# Patient Record
Sex: Male | Born: 1940 | Race: White | Hispanic: No | Marital: Married | Smoking: Never smoker
Health system: Southern US, Community
[De-identification: ages and names within clinical notes are randomized; demographics above are authoritative.]

## PROBLEM LIST (undated history)

## (undated) DIAGNOSIS — J9621 Acute and chronic respiratory failure with hypoxia: Secondary | ICD-10-CM

## (undated) DIAGNOSIS — I10 Essential (primary) hypertension: Secondary | ICD-10-CM

## (undated) DIAGNOSIS — R519 Headache, unspecified: Secondary | ICD-10-CM

## (undated) DIAGNOSIS — R351 Nocturia: Secondary | ICD-10-CM

## (undated) DIAGNOSIS — Z930 Tracheostomy status: Secondary | ICD-10-CM

## (undated) DIAGNOSIS — Z973 Presence of spectacles and contact lenses: Secondary | ICD-10-CM

## (undated) DIAGNOSIS — J398 Other specified diseases of upper respiratory tract: Secondary | ICD-10-CM

## (undated) DIAGNOSIS — R51 Headache: Secondary | ICD-10-CM

## (undated) DIAGNOSIS — M1990 Unspecified osteoarthritis, unspecified site: Secondary | ICD-10-CM

## (undated) DIAGNOSIS — J690 Pneumonitis due to inhalation of food and vomit: Secondary | ICD-10-CM

## (undated) DIAGNOSIS — R350 Frequency of micturition: Secondary | ICD-10-CM

## (undated) DIAGNOSIS — Z8709 Personal history of other diseases of the respiratory system: Secondary | ICD-10-CM

## (undated) DIAGNOSIS — F419 Anxiety disorder, unspecified: Secondary | ICD-10-CM

## (undated) DIAGNOSIS — S14103S Unspecified injury at C3 level of cervical spinal cord, sequela: Secondary | ICD-10-CM

## (undated) DIAGNOSIS — E7800 Pure hypercholesterolemia, unspecified: Secondary | ICD-10-CM

## (undated) HISTORY — PX: KNEE ARTHROSCOPY: SUR90

## (undated) HISTORY — PX: COLONOSCOPY: SHX174

## (undated) HISTORY — PX: HEMORRHOID SURGERY: SHX153

## (undated) HISTORY — DX: Essential (primary) hypertension: I10

## (undated) HISTORY — PX: EYE SURGERY: SHX253

## (undated) HISTORY — PX: ESOPHAGOGASTRODUODENOSCOPY: SHX1529

## (undated) HISTORY — PX: BACK SURGERY: SHX140

## (undated) HISTORY — DX: Pure hypercholesterolemia, unspecified: E78.00

---

## 2003-07-06 ENCOUNTER — Encounter: Payer: Self-pay | Admitting: Specialist

## 2003-07-13 ENCOUNTER — Encounter: Payer: Self-pay | Admitting: Specialist

## 2003-07-13 ENCOUNTER — Inpatient Hospital Stay (HOSPITAL_COMMUNITY): Admission: RE | Admit: 2003-07-13 | Discharge: 2003-07-15 | Payer: Self-pay | Admitting: Specialist

## 2008-04-25 ENCOUNTER — Encounter: Admission: RE | Admit: 2008-04-25 | Discharge: 2008-04-25 | Payer: Self-pay | Admitting: Specialist

## 2011-02-08 NOTE — Op Note (Signed)
NAME:  Anthony Andersen, Anthony Andersen NO.:  [id number]   MEDICAL RECORD NO.:  [id number]                   PATIENT TYPE:  AMB   LOCATION:  DAY                                  FACILITY:  [facility]   PHYSICIAN:  Jene Every, M.D.                 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   OPERATION/PROCEDURE:  Essential laminectomies of L3 and L4 with redo  decompression L3-4 and partial of L4-5.   SURGEON:  Jene Every, M.D.   ANESTHESIA:  General.   ASSISTANT:  Roma Schanz, P.A.-C.   BRIEF HISTORY AND INDICATIONS:  This is a 70 year old status post a  decompression and laminectomies of L3-4 and L4-5.  He had a spinous  processes removal of L4-5.  The patient had severe stenosis at the adjacent  segment above the level of decompression at L3-4, partially at L2-3, had a  residual neural arch at L4.  Operative intervention was indicated for  decompression at L3-4 and the adjacent segments of L2-3 and L4-5.  The risks  and benefits were discussed including bleeding, infection, anesthetic risks,  CSF leakage, epidural fibrosis, segment disease, and need for fusion in the  future, etc.   DESCRIPTION OF PROCEDURE:  With the patient in the supine position and after  induction of adequate general anesthesia and 1 g of Kefzol, the patient was  placed prone on the McBride frame.  All bony prominences were well padded.  The lumbar region was prepped and draped in the usual sterile fashion.   Previous surgical incision was utilized and extended cephalad to the  palpable spinous processes of L3 and L2.  Subcutaneous tissue was dissected  by using electrocautery to achieve hemostasis.  Dorsal lumbar fascia was  identified and divided in  line  with the skin incision.  Paraspinous muscle  elevated from the lamina of L2-3 and the residual neural arch of L4.  McCullough retractor was placed.  Confirmatory radiographs obtained.  I  removed the spinous process of L3 and partial of L2.  I then performed a  laminectomy of L3, first entering laterally utilizing osteotomes to remove  the medial facet of L3-4.  I then used a 2 mm Kerrison and finally a 3 mm  Kerrison to remove the lamina of L3.  There was stenosis at L2-3 in the  lateral recess.  Neural elements was well protected and we decompressed the  lateral recess with the 2 mm and 3 mm Kerrisons.  Next, we continued caudad.  There was a residual neural arch at L4 and this was removed with the 2 mm  Kerrison.  This was decompressed with the 2 mm Kerrison.  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  The operating microscope was draped and brought into the surgical field.  I  performed foraminotomies of L3 and L4 bilaterally.  The L3 and L4 foramen  was fairly stenotic on the left.  After decompression of the neural arch and  the epidural fibrosis, the severe compression of the L3-4 essentially was  relieved.  We decompressed the upper half of the lateral recess of L4-5.  Bipolar electrocautery was utilized to achieve hemostasis.  We used bone wax  on the cancellous surfaces.  __________ probe placed on the foramen of L2,  L3 and L4 bilaterally as well as in the L5 and found them to be widely  patent.  We decompressed to the pedicles bilaterally.  It did not move more  than the 50% of the facets and the pars were preserved bilaterally.  Bipolar  electrocautery was utilized to achieve strict hemostasis.  Copiously  irrigated with antibiotic irrigation.  No evidence of CSF leak or active  bleeding.  Thrombin-soaked Gelfoam was placed in the laminotomy defect.  We  placed a Hemovac and brought it out through a lateral stab wound in the   skin. Fascia was repaired with #1 Vicryl interrupted figure-of-eight  sutures, subcutaneous tissue reapproximated with 2-0 Vicryl interrupted  sutures.  Skin reapproximated with staples.  Wound was dressed sterilely.  The patient was placed supine on the hospital bed, extubated without  difficulty and transported to the recovery room in stable condition.  The  patient tolerated the procedure well.  There were no complications.  Estimated blood loss 400 mL.                                               Jene Every, M.D.    Cordelia Pen  D:  07/13/2003  T:  07/13/2003  Job:  [id number]

## 2011-02-08 NOTE — H&P (Signed)
NAME:  Anthony Andersen, Anthony Andersen NO.:  [id number]   MEDICAL RECORD NO.:  [id number]                   PATIENT TYPE:   LOCATION:                                       FACILITY:   PHYSICIAN:  Jene Every, M.D.                 DATE OF BIRTH:  Feb 19, 1941   DATE OF ADMISSION:  07/01/2003  DATE OF DISCHARGE:                                HISTORY & PHYSICAL   CHIEF COMPLAINT:  Low back pain with radicular pain into the left lower  extremity.   HISTORY:  Mr. Lybrand is a 70 year old gentleman with complaints of bilateral  lower extremity pain.  The patient was initially seen by Caralyn Guile. Ramos,  M.D. for evaluation of spinal stenosis L3-4.  He is status post lumbar  decompression L4-5 by Paul Dykes. Fannie Knee, M.D. approximately six years ago.  The  patient has had a return in his symptoms over the past year with increasing  lower extremity pain that requires him to flex forward to get any  significant relief.  He states he is unable to walk for extended periods of  time.  MRI was obtained by Caralyn Guile. Ramos, M.D. which showed severe  stenosis L3-4.  The patient does have diminished knee reflexes bilaterally.  Quadriceps strength is 5/-5.  He does have numbness on the lateral aspect on  the right side.  Films again were reviewed which did show slight 1 cm  listhesis in flexion/extension at L4-5 with congenitally short pedicles.  His MRI shows severe stenosis L3-4 that is multifactorial.  It is not felt  that the patient would benefit from epidural steroid injections due to the  severity of his symptoms.  It is felt he would benefit from a decompression  L3-4 level.  The risks and benefits of the surgery were discussed with the  patient in detail and he wishes to proceed.  It was also discussed with him  possibly the need to decompress L2-3 and L4-5 if needed.  This will be  determined intraoperatively.   PAST MEDICAL HISTORY:  1. Hypertension.  2. Hyperlipidemia.  3.  Osteoarthritis.   CURRENT MEDICATIONS:  1. Lipitor 20 mg one p.o. daily.  2. Accupril 40 mg one p.o. daily.  3. Norvasc 5 mg one p.o. daily.   ALLERGIES:  No known drug allergies.   PAST SURGICAL HISTORY:  1. Microdiskectomy L4-5 in 1998.  2. Penile implant in 2003.   SOCIAL HISTORY:  The patient is married.  He denies any tobacco or alcohol  intake.  His wife will be his caregiver following surgery.   FAMILY HISTORY:  Mother had multiple medical problems including coronary  artery disease, hypertension, breast cancer, osteoarthritis.  Father  deceased at age 37 of cancer.   REVIEW OF SYSTEMS:  GENERAL:  The patient denies any fevers, chills, night  sweats, or bleeding tendencies.  CNS:  No  blurred/double vision, seizure,  headache, or paralysis.  RESPIRATORY:  No shortness of breath, productive  cough, or hemoptysis.  CARDIOVASCULAR:  No chest pain, angina, orthopnea.  GENITOURINARY:  No dysuria, hematuria, discharge.  GASTROINTESTINAL:  No  nausea, vomiting, diarrhea, constipation, melena, or bloody stools.   PHYSICAL EXAMINATION:  VITAL SIGNS:  Pulse 100, respiratory 16, blood  pressure 132/88.  GENERAL:  This is a well-developed, well-nourished 70 year old gentleman in  mild to moderate distress.  He does walk with antalgic gait.  HEENT:  Atraumatic, normocephalic.  Pupils are equal, round, and reactive to  light.  EOMs intact.  NECK:  Supple.  No lymphadenopathy.  CHEST:  Clear to auscultation bilaterally.  No rhonchi, wheezes, or rales.  BREASTS:  Not examined.  Not pertinent to HPI.  GENITOURINARY:  Not examined.  Not pertinent to HPI.  HEART:  Regular rate and rhythm without murmurs, rubs, or gallops.  ABDOMEN:  Soft, nontender, nondistended.  Bowel sounds x4.  SKIN:  No rashes or lesions are noted.  EXTREMITIES:  The patient does have diminished knee reflexes bilaterally.  Quadriceps strength is  5/-5.  Does have numbness on the lateral aspect of the right lower   extremity.   IMPRESSION:  Severe spinal stenosis L3-4.   PLAN:  The patient will be admitted to [facility] to undergo a  decompression at L3-4 with possible decompression L2-3 and L4-5 to be  determined intraoperatively.     Roma Schanz, P.A.                   Jene Every, M.D.    CS/MEDQ  D:  07/01/2003  T:  07/01/2003  Job:  [id number]

## 2015-06-21 ENCOUNTER — Ambulatory Visit (INDEPENDENT_AMBULATORY_CARE_PROVIDER_SITE_OTHER): Payer: Medicare Other | Admitting: Neurology

## 2015-06-21 ENCOUNTER — Encounter: Payer: Self-pay | Admitting: Neurology

## 2015-06-21 VITALS — BP 129/79 | HR 95 | Ht 69.0 in | Wt 222.8 lb

## 2015-06-21 DIAGNOSIS — R202 Paresthesia of skin: Secondary | ICD-10-CM

## 2015-06-21 DIAGNOSIS — R29818 Other symptoms and signs involving the nervous system: Secondary | ICD-10-CM

## 2015-06-21 DIAGNOSIS — R2689 Other abnormalities of gait and mobility: Secondary | ICD-10-CM

## 2015-06-21 DIAGNOSIS — R531 Weakness: Secondary | ICD-10-CM

## 2015-06-21 DIAGNOSIS — E538 Deficiency of other specified B group vitamins: Secondary | ICD-10-CM

## 2015-06-21 DIAGNOSIS — G609 Hereditary and idiopathic neuropathy, unspecified: Secondary | ICD-10-CM | POA: Diagnosis not present

## 2015-06-21 DIAGNOSIS — E519 Thiamine deficiency, unspecified: Secondary | ICD-10-CM

## 2015-06-21 NOTE — Progress Notes (Signed)
[facility]    Provider:  Dr Lucia Gaskins Referring Provider: Venita Lick, MD Primary Care Physician:  Margaree Mackintosh, MD  CC:  Balance problems  HPI:  Anthony Andersen is a 74 y.o. male here as a referral from Dr. Shon Baton for balance problems. He sees Dr. Ethelene Hal as pain management and has chronic LBP and a few surgeries for spinal stenosis. Symptoms worse with closing his eyes and in low light has to hold onto the walls. Balance problems started a year ago, slowly progressive, worsening. No falls. He describes weakness. Difficulty getting out of low seats. Difficulty climbing stairs, harder coming down than up and also feels balance is poor going down the stairs. No SOB, no droop eyelids, no diplopia. He is having swallowing difficulties. His voice has become weakner. No shuffling of gait, no tremors, smell is ok, no drooling, no hallucinations. He feels generally fatigued. He has tingling in the lower legs worse in the mornings. He has cramping in his legs. No recent weight loss or muscle loss. Just doesn't feel strong in the legs. He has pain in the neck and in the low back and shoulders which is chronic. He also has been referred to rheumatology for evaluation. No history of significant alcohol night. No numbness or tingling in the feet or hands. He has not noticed fasciculations anywhere. He takes Neurontin at night which helps. Memory is worsening. LBP worse with long periods of walking or standing and then he need to sit down  And sitting down helps the back pain. He has shppting pain down the left side of the leg. He also has shooting pain into his left arm but not as bad as in the left leg. He has numbness and tingling in the left leg and leg arm and in the neck.   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 Posteriorly soft tissue thickening is asymmetrically prominent on the right and results in moderate right ventral flattening of the cord contributing to moderate central canal stenosis. Mildly narrowed central canal at C3-C4 with mild anterolisthesis of C3 and mild disc bulge. Moderate right foraminal narrowing is present. Mild ventral flattening of the cord and mild central canal stenosis at C4-C5 and there are 2 mild disc bulge. Moderate left foraminal narrowing results from uncovertebral joint hypertrophy. Disc bulge at C5-C6 eccentric to the left with a small left posterolateral and foraminal protrusion and mild dorsal buckling of the ligamentum flavum results in mild central canal stenosis and moderate left foraminal narrowing. Mild disc bulge at T1-T2 with small left paracentral protrusion and mild dorsal buckling of the ligamentum flavum results in mild canal stenosis and mild left ventral flattening of the cord. 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  Review of Systems: Patient complains of symptoms per HPI as well as the following symptoms: fatigue, easy bruising, snoring, hearing loss, ringing in ears, spinning sensation, trouble swallowing, constipation, joint pain, leg cramps, aching muscles, weakness, difficulty swallowin, snoring, notenough sleep, decreased energy. Pertinent negatives per HPI. All others negative.   Social History   Social History  . Marital Status: Married    Spouse Name: Bonita Quin  . Number of Children: 1  . Years of Education: 12   Occupational History  .  Self-employed     Social History Main Topics  . Smoking status: Never Smoker   . Smokeless tobacco: Not on file  . Alcohol Use: 0.0 oz/week    0 Standard drinks or equivalent per week     Comment: Occasional   . Drug Use: No  . Sexual Activity: Not on file   Other Topics  Concern  . Not on file   Social History Narrative   Lives at home with spouse.   Caffeine use: 1 cup coffee every morning    Family History  Problem Relation Age of Onset  . Diabetes Mellitus I    . Hypertension    . Cancer Mother   . Cancer Father   . Cancer Maternal Grandmother   . Cancer Paternal Grandfather     Past Medical History  Diagnosis Date  . Hypertension   . High cholesterol     Past Surgical History  Procedure Laterality Date  . Back surgery      x2  . Hemorrhoid surgery    . Knee arthroscopy      Current Outpatient Prescriptions  Medication Sig Dispense Refill  . Acetaminophen (TYLENOL EXTRA STRENGTH PO) Take 1 tablet by mouth daily as needed.    Marland Kitchen aspirin 81 MG tablet Take 81 mg by mouth daily.    . benazepril (LOTENSIN) 20 MG tablet Take 20 mg by mouth daily.  0  . diclofenac (VOLTAREN) 75 MG EC tablet Take 75 mg by mouth 2 (two) times daily.   0  . gabapentin (NEURONTIN) 300 MG capsule Take 300 mg by mouth 3 (three) times daily.  0  . HYDROcodone-acetaminophen (NORCO) 10-325 MG tablet Take 1 tablet by mouth 4 (four) times daily as needed.  0  . Multiple Vitamins-Minerals (MULTIVITAMIN ADULT PO) Take 1 tablet by mouth daily.    Marland Kitchen tiZANidine (ZANAFLEX) 4 MG tablet Take 4 mg by mouth 4 (four) times daily as needed. Pt takes max 2 times per day  0  . ZETIA 10 MG tablet Take 10 mg by mouth daily.  0   No current facility-administered medications for this visit.    Allergies as of 06/21/2015  . (No Known Allergies)    Vitals: BP 129/79 mmHg  Pulse 95  Ht  (1.753 m)  Wt 222 lb 12.8 oz (101.061 kg)  BMI 32.89 kg/m2 Last Weight:  Wt Readings from Last 1 Encounters:  06/21/15 222 lb 12.8 oz (101.061 kg)   Last Height:   Ht Readings from Last 1 Encounters:  06/21/15  (1.753 m)   Physical exam: Exam: Gen: NAD, conversant, well nourised, obese, well groomed                     CV: RRR, no MRG. No Carotid Bruits. No peripheral edema,  warm, nontender Eyes: Conjunctivae clear without exudates or hemorrhage  Neuro: Detailed Neurologic Exam  Speech:    Speech is normal; fluent and spontaneous with normal comprehension.  Cognition:    The patient is oriented to person, place, and time;     recent and remote memory intact;     language fluent;     normal attention, concentration,     fund of knowledge Cranial Nerves:    The pupils are equal, round, and reactive to light. The fundi are nflat. Visual fields are full to finger confrontation. Extraocular movements are intact. Trigeminal sensation is intact and the muscles of mastication are normal. The face is symmetric. The  palate elevates in the midline. Hearing intact. Voice is normal. Shoulder shrug is normal. The tongue has normal motion without fasciculations.   Coordination:    Normal finger to nose and heel to shin. Normal rapid alternating movements.   Gait: good stride and turning  Motor Observation: no drift.    No asymmetry, no atrophy, and no involuntary movements noted. Tone:    Normal muscle tone.    Posture:    Mildly stooped    Strength: Difficulty getting out of seat w/o using hands but can do it Mild left hip felxion otherwise normal      Sensation: intact to LT, pin prick, 3-4 seconds vibration,  Sway on romberg.      Reflex Exam:  DTR's: Absent left AJ otherwise deep tendon reflexes in the upper and lower extremities are normal bilaterally.   Toes:    The toes are equivocal bilaterally.   Clonus:    Clonus is absent.      Assessment/Plan:   74 y.o. male here as a referral from Dr. Shon Baton for balance problems. He sees Dr. Ethelene Hal as pain management and has chronic LBP and a few surgeries for spinal stenosis. Balance problems started a year ago, slowly progressive, worsening. No falls. He describes weakness. Difficulty getting out of low seats. Difficulty climbing stairs, harder coming down than up and also feels balance is poor going down  the stairs. Exam is non-focal. 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 Neuro exam is unremarkable except for some sway on romberg. Sensory exam intact. Will order serum neuropathy panel due to reported paresthesias and polyneuropathy can cause imbalance. If labs unremarkable will order MRI of the brain.  Naomie Dean, MD  [facility] [address] [place], [place] [address]  Phone [phone] Fax [phone]

## 2015-06-21 NOTE — Patient Instructions (Signed)
Overall you are doing fairly well but I do want to suggest a few things today:   Remember to drink plenty of fluid, eat healthy meals and do not skip any meals. Try to eat protein with a every meal and eat a healthy snack such as fruit or nuts in between meals. Try to keep a regular sleep-wake schedule and try to exercise daily, particularly in the form of walking, 20-30 minutes a day, if you can.   As far as your medications are concerned, I would like to suggest Diagnostic testing: labs  I would like to see you back if needed, sooner if we need to. Please call us with any interim questions, concerns, problems, updates or refill requests.   Our phone number is [phone]. We also have an after hours call service for urgent matters and there is a physician on-call for urgent questions. For any emergencies you know to call 911 or go to the nearest emergency room

## 2015-06-23 ENCOUNTER — Telehealth: Payer: Self-pay | Admitting: Neurology

## 2015-06-23 DIAGNOSIS — R202 Paresthesia of skin: Secondary | ICD-10-CM | POA: Insufficient documentation

## 2015-06-23 DIAGNOSIS — R2689 Other abnormalities of gait and mobility: Secondary | ICD-10-CM | POA: Insufficient documentation

## 2015-06-23 DIAGNOSIS — R531 Weakness: Secondary | ICD-10-CM | POA: Insufficient documentation

## 2015-06-23 LAB — METHYLMALONIC ACID, SERUM: METHYLMALONIC ACID: 145 nmol/L (ref 0–378)

## 2015-06-23 LAB — HEMOGLOBIN A1C
ESTIMATED AVERAGE GLUCOSE: 148 mg/dL
Hgb A1c MFr Bld: 6.8 % — ABNORMAL HIGH (ref 4.8–5.6)

## 2015-06-23 LAB — B12 AND FOLATE PANEL
Folate: >20 ng/mL (ref >3.0)
Vitamin B-12: 406 pg/mL (ref 211–946)

## 2015-06-23 LAB — MULTIPLE MYELOMA PANEL, SERUM
ALBUMIN/GLOB SERPL: 1.5 (ref 0.7–1.7)
ALPHA 1: 0.2 g/dL (ref 0.0–0.4)
Albumin SerPl Elph-Mcnc: 4.1 g/dL (ref 2.9–4.4)
Alpha2 Glob SerPl Elph-Mcnc: 0.8 g/dL (ref 0.4–1.0)
B-Globulin SerPl Elph-Mcnc: 1.2 g/dL (ref 0.7–1.3)
GAMMA GLOB SERPL ELPH-MCNC: 0.6 g/dL (ref 0.4–1.8)
GLOBULIN, TOTAL: 2.8 g/dL (ref 2.2–3.9)
IGA/IMMUNOGLOBULIN A, SERUM: 218 mg/dL (ref 61–437)
IgG (Immunoglobin G), Serum: 621 mg/dL — ABNORMAL LOW (ref 700–1600)
IgM (Immunoglobulin M), Srm: 26 mg/dL (ref 15–143)
Total Protein: 6.9 g/dL (ref 6.0–8.5)

## 2015-06-23 LAB — TSH: TSH: 1.32 u[IU]/mL (ref 0.450–4.500)

## 2015-06-23 LAB — VITAMIN B1: Thiamine: 192.9 nmol/L (ref 66.5–200.0)

## 2015-06-23 NOTE — Telephone Encounter (Signed)
Call for MRi of the brain

## 2015-06-26 ENCOUNTER — Other Ambulatory Visit: Payer: Self-pay | Admitting: Neurology

## 2015-06-26 DIAGNOSIS — R2689 Other abnormalities of gait and mobility: Secondary | ICD-10-CM

## 2015-06-26 DIAGNOSIS — R531 Weakness: Secondary | ICD-10-CM

## 2015-06-26 DIAGNOSIS — W19XXXA Unspecified fall, initial encounter: Secondary | ICD-10-CM

## 2015-06-26 DIAGNOSIS — R42 Dizziness and giddiness: Secondary | ICD-10-CM

## 2015-06-26 DIAGNOSIS — R270 Ataxia, unspecified: Secondary | ICD-10-CM

## 2015-06-26 NOTE — Telephone Encounter (Signed)
LVM detailed for pt/wife to call back. Advised labs unremarkable and hemoglobin A1C was slightly elevated at 6.8. He can f/u with his PCP about this. Dr. Lucia Gaskins would also like to order MRI brain to ensure she is not missing anything. Asked them to call us back to let us know. Gave [facility] phone number and office hours.

## 2015-06-26 NOTE — Telephone Encounter (Signed)
Wife Mattis Featherly returned call

## 2015-06-26 NOTE — Telephone Encounter (Signed)
Anthony Andersen - I sent you a result note as well about this. Let patient know his labs were unremarkable. His HgbA1c is elevated at 6.8 which is slightly elevated. I would like to order an MRi of his brain just to ensure we are not missing anything. Would you ask patient if he would be open to an MRI of his brain?

## 2015-06-26 NOTE — Telephone Encounter (Signed)
I called wife back. She would like me to call husband at [phone] to find out if he wants to proceed with MRI. I will call him.

## 2015-06-26 NOTE — Telephone Encounter (Signed)
Mri of the brain ordered thanks!!

## 2015-06-26 NOTE — Telephone Encounter (Signed)
Called Anthony Andersen and he would like to do MRI brain. Said to go ahead and have Dr. Lucia Gaskins put in the order. I advised someone will call him to get this scheduled once order is placed. He verbalized understanding.

## 2015-07-05 ENCOUNTER — Ambulatory Visit
Admission: RE | Admit: 2015-07-05 | Discharge: 2015-07-05 | Disposition: A | Discharge status: Home / Self Care | Payer: Medicare Other | Source: Ambulatory Visit | Attending: Neurology | Admitting: Neurology

## 2015-07-05 DIAGNOSIS — R42 Dizziness and giddiness: Secondary | ICD-10-CM

## 2015-07-05 DIAGNOSIS — R270 Ataxia, unspecified: Secondary | ICD-10-CM | POA: Diagnosis not present

## 2015-07-05 DIAGNOSIS — W19XXXA Unspecified fall, initial encounter: Secondary | ICD-10-CM

## 2015-07-05 DIAGNOSIS — R2689 Other abnormalities of gait and mobility: Secondary | ICD-10-CM

## 2015-07-05 DIAGNOSIS — R531 Weakness: Secondary | ICD-10-CM

## 2015-07-06 ENCOUNTER — Telehealth: Payer: Self-pay | Admitting: *Deleted

## 2015-07-06 NOTE — Telephone Encounter (Signed)
Spoke w/ pt wife. Pt was not home at this time. Advised per Dr. Lucia GaskinsAhern that MRI brain unremarkable for his age. There is little atrophy (shrinkage of the brain) but this is normal as we age. No lesions, masses, or tumors or anything to be concerned about and no cause for his imbalance. She verbalized understanding. She wanted to know how to get a copy of the results. I advised she will have come to our office to sign record release form or she can sign up for Mychart. On the AVS they got at there visit, the very last page walks them through how to sign up. She can log in and see the results this way as well. There is also a phone number on there to contact if they need help with this. She verbalized understanding.

## 2015-07-06 NOTE — Telephone Encounter (Signed)
-----   Message from Anson FretAntonia B Ahern, MD sent at 07/06/2015  4:29 PM EDT ----- Let patient know MRI of the brain was unremarkable for age. There is a little atrophy but that is normal as we age. No lesions or masses or tumor or anything to be concerned about and no cause for his imbalance. thanks

## 2015-08-21 ENCOUNTER — Ambulatory Visit: Payer: Self-pay | Admitting: Physician Assistant

## 2015-08-31 ENCOUNTER — Encounter (HOSPITAL_COMMUNITY): Payer: Self-pay

## 2015-08-31 ENCOUNTER — Encounter (HOSPITAL_COMMUNITY)
Admission: RE | Admit: 2015-08-31 | Discharge: 2015-08-31 | Disposition: A | Discharge status: Home / Self Care | Payer: Medicare Other | Source: Ambulatory Visit | Attending: Orthopedic Surgery | Admitting: Orthopedic Surgery

## 2015-08-31 DIAGNOSIS — Z01812 Encounter for preprocedural laboratory examination: Secondary | ICD-10-CM | POA: Insufficient documentation

## 2015-08-31 DIAGNOSIS — M4804 Spinal stenosis, thoracic region: Secondary | ICD-10-CM | POA: Insufficient documentation

## 2015-08-31 HISTORY — DX: Anxiety disorder, unspecified: F41.9

## 2015-08-31 HISTORY — DX: Headache, unspecified: R51.9

## 2015-08-31 HISTORY — DX: Headache: R51

## 2015-08-31 HISTORY — DX: Presence of spectacles and contact lenses: Z97.3

## 2015-08-31 HISTORY — DX: Personal history of other diseases of the respiratory system: Z87.09

## 2015-08-31 HISTORY — DX: Nocturia: R35.1

## 2015-08-31 HISTORY — DX: Unspecified osteoarthritis, unspecified site: M19.90

## 2015-08-31 HISTORY — DX: Frequency of micturition: R35.0

## 2015-08-31 LAB — BASIC METABOLIC PANEL
ANION GAP: 6 (ref 5–15)
BUN: 13 mg/dL (ref 6–20)
CO2: 26 mmol/L (ref 22–32)
Calcium: 9.5 mg/dL (ref 8.9–10.3)
Chloride: 109 mmol/L (ref 101–111)
Creatinine, Ser: 0.85 mg/dL (ref 0.61–1.24)
GFR calc Af Amer: >60 mL/min (ref >60)
GLUCOSE: 122 mg/dL — AB (ref 65–99)
POTASSIUM: 4.5 mmol/L (ref 3.5–5.1)
Sodium: 141 mmol/L (ref 135–145)

## 2015-08-31 LAB — CBC
HCT: 40.5 % (ref 39.0–52.0)
Hemoglobin: 13.4 g/dL (ref 13.0–17.0)
MCH: 31.1 pg (ref 26.0–34.0)
MCHC: 33.1 g/dL (ref 30.0–36.0)
MCV: 94 fL (ref 78.0–100.0)
PLATELETS: 220 10*3/uL (ref 150–400)
RBC: 4.31 MIL/uL (ref 4.22–5.81)
RDW: 12.2 % (ref 11.5–15.5)
WBC: 7.1 10*3/uL (ref 4.0–10.5)

## 2015-08-31 LAB — SURGICAL PCR SCREEN
MRSA, PCR: NEGATIVE
Staphylococcus aureus: NEGATIVE

## 2015-08-31 NOTE — Pre-Procedure Instructions (Signed)
    Denyse AmassJohn L Prinsen  08/31/2015      [facility] [address] [place] [address] [place] [id number] Phone: [phone] Fax: [phone]    Your procedure is scheduled on Wednesday, December 14th, 2016.  [facility].M.  Call this number if you have problems the morning of surgery:  [phone]   Remember:  Do not eat food or drink liquids after midnight.   Take these medicines the morning of surgery with A SIP OF WATER: Acetaminophen (Tylenol) if needed, Gabapentin (Neurontin), Hydrocodone-acetaminophen (Norco) if needed, Tizanidine (Zanaflex) if needed.    Stop taking: Diclofenac (Voltaren), Aspirin, NSAIDS, Aleve, Naproxen, Ibuprofen, BC's, Goody's, Advil, Motrin, all herbal medications, all vitamins.     Do not wear jewelry.  Do not wear lotions, powders, or colognes.  You may NOT wear deodorant.  Men may shave face and neck.  Do not bring valuables to the hospital.  [facility] is not responsible for any belongings or valuables.  Contacts, dentures or bridgework may not be worn into surgery.  Leave your suitcase in the car.  After surgery it may be brought to your room.  For patients admitted to the hospital, discharge time will be determined by your treatment team.  Patients discharged the day of surgery will not be allowed to drive home.   Special instructions:  See attached.   Please read over the following fact sheets that you were given. Pain Booklet, Coughing and Deep Breathing, MRSA Information and Surgical Site Infection Prevention

## 2015-08-31 NOTE — Progress Notes (Signed)
PCP- Dr. Margaree MackintoshJohn Andersen Cardiologist - denies  EKG - requested CXR- denies  Echo/stress test/cardiac cath - denies  Patient denies chest pain and shortness of breath at PAT appointment.  Patient states that he recently had an EKG at Dr. Loralie ChampagneMcKinney's office.  EKG has been requested.  If not received, will need EKG DOS.

## 2015-08-31 NOTE — Progress Notes (Signed)
   08/31/15 0923  OBSTRUCTIVE SLEEP APNEA  Have you ever been diagnosed with sleep apnea through a sleep study? No  Do you snore loudly (loud enough to be heard through closed doors)?  1  Do you often feel tired, fatigued, or sleepy during the daytime (such as falling asleep during driving or talking to someone)? 1  Has anyone observed you stop breathing during your sleep? 0  Do you have, or are you being treated for high blood pressure? 1  BMI more than 35 kg/m2? 0  Age > 50 (1-yes) 1  Neck circumference greater than:Male 16 inches or larger, Male 17inches or larger? 0  Male Gender (Yes=1) 1  Obstructive Sleep Apnea Score 5

## 2015-09-05 NOTE — Progress Notes (Signed)
Call to Dr. Loralie ChampagneMcKinney's office, on ans. Service, needing EKG, med. Clearance & LOV

## 2015-09-06 ENCOUNTER — Ambulatory Visit (HOSPITAL_COMMUNITY): Payer: Medicare Other

## 2015-09-06 ENCOUNTER — Ambulatory Visit (HOSPITAL_COMMUNITY): Payer: Medicare Other | Admitting: Certified Registered Nurse Anesthetist

## 2015-09-06 ENCOUNTER — Ambulatory Visit (HOSPITAL_COMMUNITY)
Admission: AD | Admit: 2015-09-06 | Discharge: 2015-09-07 | Disposition: A | Discharge status: Home / Self Care | Payer: Medicare Other | Source: Ambulatory Visit | Attending: Orthopedic Surgery | Admitting: Orthopedic Surgery

## 2015-09-06 ENCOUNTER — Encounter (HOSPITAL_COMMUNITY)
Admission: AD | Disposition: A | Discharge status: Home / Self Care | Payer: Medicare Other | Source: Ambulatory Visit | Attending: Orthopedic Surgery

## 2015-09-06 ENCOUNTER — Encounter (HOSPITAL_COMMUNITY): Payer: Self-pay | Admitting: *Deleted

## 2015-09-06 DIAGNOSIS — Z7982 Long term (current) use of aspirin: Secondary | ICD-10-CM | POA: Insufficient documentation

## 2015-09-06 DIAGNOSIS — Z79899 Other long term (current) drug therapy: Secondary | ICD-10-CM | POA: Diagnosis not present

## 2015-09-06 DIAGNOSIS — I10 Essential (primary) hypertension: Secondary | ICD-10-CM | POA: Diagnosis not present

## 2015-09-06 DIAGNOSIS — E7800 Pure hypercholesterolemia, unspecified: Secondary | ICD-10-CM | POA: Insufficient documentation

## 2015-09-06 DIAGNOSIS — J45909 Unspecified asthma, uncomplicated: Secondary | ICD-10-CM | POA: Insufficient documentation

## 2015-09-06 DIAGNOSIS — M4804 Spinal stenosis, thoracic region: Secondary | ICD-10-CM | POA: Diagnosis not present

## 2015-09-06 DIAGNOSIS — M1990 Unspecified osteoarthritis, unspecified site: Secondary | ICD-10-CM | POA: Diagnosis not present

## 2015-09-06 DIAGNOSIS — Z419 Encounter for procedure for purposes other than remedying health state, unspecified: Secondary | ICD-10-CM

## 2015-09-06 DIAGNOSIS — M4714 Other spondylosis with myelopathy, thoracic region: Secondary | ICD-10-CM | POA: Diagnosis not present

## 2015-09-06 DIAGNOSIS — M5134 Other intervertebral disc degeneration, thoracic region: Secondary | ICD-10-CM | POA: Diagnosis present

## 2015-09-06 HISTORY — PX: LUMBAR LAMINECTOMY/DECOMPRESSION MICRODISCECTOMY: SHX5026

## 2015-09-06 SURGERY — LUMBAR LAMINECTOMY/DECOMPRESSION MICRODISCECTOMY 2 LEVELS
Anesthesia: General

## 2015-09-06 MED ORDER — FLEET ENEMA 7-19 GM/118ML RE ENEM: 1.0000 | ENEMA | Freq: Once | RECTAL | Status: DC

## 2015-09-06 MED ORDER — PHENYLEPHRINE 40 MCG/ML (10ML) SYRINGE FOR IV PUSH (FOR BLOOD PRESSURE SUPPORT)
PREFILLED_SYRINGE | INTRAVENOUS | Status: AC
  Filled 2015-09-06: qty 10

## 2015-09-06 MED ORDER — FENTANYL CITRATE (PF) 250 MCG/5ML IJ SOLN
INTRAMUSCULAR | Status: DC | PRN
  Administered 2015-09-06: 150 ug via INTRAVENOUS
  Administered 2015-09-06: 100 ug via INTRAVENOUS

## 2015-09-06 MED ORDER — DEXAMETHASONE SODIUM PHOSPHATE 4 MG/ML IJ SOLN
4.0000 mg | Freq: Four times a day (QID) | INTRAMUSCULAR | Status: AC
  Administered 2015-09-06: 4 mg via INTRAVENOUS
  Filled 2015-09-06 (×3): qty 1

## 2015-09-06 MED ORDER — FENTANYL CITRATE (PF) 250 MCG/5ML IJ SOLN
INTRAMUSCULAR | Status: AC
  Filled 2015-09-06: qty 5

## 2015-09-06 MED ORDER — HYDROMORPHONE HCL 1 MG/ML IJ SOLN: 0.2500 mg | INTRAMUSCULAR | Status: DC | PRN

## 2015-09-06 MED ORDER — SUCCINYLCHOLINE CHLORIDE 20 MG/ML IJ SOLN
INTRAMUSCULAR | Status: DC | PRN
  Administered 2015-09-06: 100 mg via INTRAVENOUS

## 2015-09-06 MED ORDER — MIDAZOLAM HCL 2 MG/2ML IJ SOLN
INTRAMUSCULAR | Status: DC | PRN
  Administered 2015-09-06: 2 mg via INTRAVENOUS

## 2015-09-06 MED ORDER — PROPOFOL 10 MG/ML IV BOLUS
INTRAVENOUS | Status: DC | PRN
  Administered 2015-09-06: 50 mg via INTRAVENOUS
  Administered 2015-09-06: 150 mg via INTRAVENOUS

## 2015-09-06 MED ORDER — 0.9 % SODIUM CHLORIDE (POUR BTL) OPTIME
TOPICAL | Status: DC | PRN
  Administered 2015-09-06: 1000 mL

## 2015-09-06 MED ORDER — SUCCINYLCHOLINE CHLORIDE 20 MG/ML IJ SOLN
INTRAMUSCULAR | Status: AC
  Filled 2015-09-06: qty 1

## 2015-09-06 MED ORDER — METOPROLOL TARTRATE 1 MG/ML IV SOLN
INTRAVENOUS | Status: AC
  Filled 2015-09-06: qty 5

## 2015-09-06 MED ORDER — ACETAMINOPHEN 10 MG/ML IV SOLN
INTRAVENOUS | Status: DC | PRN
  Administered 2015-09-06: 1000 mg via INTRAVENOUS

## 2015-09-06 MED ORDER — PHENYLEPHRINE HCL 10 MG/ML IJ SOLN
INTRAMUSCULAR | Status: DC | PRN
  Administered 2015-09-06: 120 ug via INTRAVENOUS
  Administered 2015-09-06: 80 ug via INTRAVENOUS
  Administered 2015-09-06: 120 ug via INTRAVENOUS
  Administered 2015-09-06: 80 ug via INTRAVENOUS

## 2015-09-06 MED ORDER — ONDANSETRON HCL 4 MG/2ML IJ SOLN
INTRAMUSCULAR | Status: DC | PRN
  Administered 2015-09-06: 4 mg via INTRAVENOUS

## 2015-09-06 MED ORDER — OXYCODONE-ACETAMINOPHEN 10-325 MG PO TABS: 1.0000 | ORAL_TABLET | ORAL | Status: AC | PRN

## 2015-09-06 MED ORDER — SODIUM CHLORIDE 0.9 % IJ SOLN: 3.0000 mL | INTRAMUSCULAR | Status: DC | PRN

## 2015-09-06 MED ORDER — PHENOL 1.4 % MT LIQD: 1.0000 | OROMUCOSAL | Status: DC | PRN

## 2015-09-06 MED ORDER — CEFAZOLIN SODIUM-DEXTROSE 2-3 GM-% IV SOLR
2.0000 g | INTRAVENOUS | Status: AC
  Administered 2015-09-06 (×2): 2 g via INTRAVENOUS
  Filled 2015-09-06: qty 50

## 2015-09-06 MED ORDER — HEMOSTATIC AGENTS (NO CHARGE) OPTIME
TOPICAL | Status: DC | PRN
  Administered 2015-09-06 (×2): 1 via TOPICAL

## 2015-09-06 MED ORDER — GABAPENTIN 300 MG PO CAPS
300.0000 mg | ORAL_CAPSULE | Freq: Three times a day (TID) | ORAL | Status: DC
  Administered 2015-09-06 – 2015-09-07 (×3): 300 mg via ORAL
  Filled 2015-09-06 (×3): qty 1

## 2015-09-06 MED ORDER — DEXAMETHASONE 4 MG PO TABS
4.0000 mg | ORAL_TABLET | Freq: Four times a day (QID) | ORAL | Status: AC
  Administered 2015-09-06 – 2015-09-07 (×2): 4 mg via ORAL
  Filled 2015-09-06 (×2): qty 1

## 2015-09-06 MED ORDER — ONDANSETRON HCL 4 MG/2ML IJ SOLN: 4.0000 mg | INTRAMUSCULAR | Status: DC | PRN

## 2015-09-06 MED ORDER — METHOCARBAMOL 1000 MG/10ML IJ SOLN: 500.0000 mg | Freq: Four times a day (QID) | INTRAVENOUS | Status: DC | PRN

## 2015-09-06 MED ORDER — CEFAZOLIN SODIUM 1-5 GM-% IV SOLN
1.0000 g | Freq: Three times a day (TID) | INTRAVENOUS | Status: AC
  Administered 2015-09-06 – 2015-09-07 (×2): 1 g via INTRAVENOUS
  Filled 2015-09-06 (×3): qty 50

## 2015-09-06 MED ORDER — EZETIMIBE 10 MG PO TABS
10.0000 mg | ORAL_TABLET | Freq: Every day | ORAL | Status: DC
  Administered 2015-09-06 – 2015-09-07 (×2): 10 mg via ORAL
  Filled 2015-09-06 (×2): qty 1

## 2015-09-06 MED ORDER — PROPOFOL 500 MG/50ML IV EMUL
INTRAVENOUS | Status: DC | PRN
  Administered 2015-09-06: 50 ug/kg/min via INTRAVENOUS
  Administered 2015-09-06 (×2): via INTRAVENOUS

## 2015-09-06 MED ORDER — METHOCARBAMOL 500 MG PO TABS
500.0000 mg | ORAL_TABLET | Freq: Four times a day (QID) | ORAL | Status: DC | PRN
  Administered 2015-09-07: 500 mg via ORAL
  Filled 2015-09-06 (×2): qty 1

## 2015-09-06 MED ORDER — ONDANSETRON HCL 4 MG/2ML IJ SOLN
INTRAMUSCULAR | Status: AC
  Filled 2015-09-06: qty 2

## 2015-09-06 MED ORDER — GLYCOPYRROLATE 0.2 MG/ML IJ SOLN
INTRAMUSCULAR | Status: AC
  Filled 2015-09-06: qty 1

## 2015-09-06 MED ORDER — ACETAMINOPHEN 10 MG/ML IV SOLN
INTRAVENOUS | Status: AC
  Filled 2015-09-06: qty 100

## 2015-09-06 MED ORDER — PHENYLEPHRINE HCL 10 MG/ML IJ SOLN
10.0000 mg | INTRAMUSCULAR | Status: DC | PRN
  Administered 2015-09-06: 25 ug/min via INTRAVENOUS

## 2015-09-06 MED ORDER — METHOCARBAMOL 500 MG PO TABS: 500.0000 mg | ORAL_TABLET | Freq: Three times a day (TID) | ORAL | Status: AC | PRN

## 2015-09-06 MED ORDER — LACTATED RINGERS IV SOLN
INTRAVENOUS | Status: DC | PRN
  Administered 2015-09-06 (×3): via INTRAVENOUS

## 2015-09-06 MED ORDER — THROMBIN 20000 UNITS EX SOLR
CUTANEOUS | Status: AC
  Filled 2015-09-06: qty 20000

## 2015-09-06 MED ORDER — ARTIFICIAL TEARS OP OINT
TOPICAL_OINTMENT | OPHTHALMIC | Status: DC | PRN
  Administered 2015-09-06: 1 via OPHTHALMIC

## 2015-09-06 MED ORDER — BUPIVACAINE-EPINEPHRINE (PF) 0.25% -1:200000 IJ SOLN
INTRAMUSCULAR | Status: DC | PRN
  Administered 2015-09-06: 10 mL

## 2015-09-06 MED ORDER — OXYCODONE HCL 5 MG PO TABS
10.0000 mg | ORAL_TABLET | ORAL | Status: DC | PRN
  Administered 2015-09-06 – 2015-09-07 (×3): 10 mg via ORAL
  Filled 2015-09-06 (×3): qty 2

## 2015-09-06 MED ORDER — DOCUSATE SODIUM 100 MG PO CAPS
100.0000 mg | ORAL_CAPSULE | Freq: Two times a day (BID) | ORAL | Status: DC
  Administered 2015-09-06 – 2015-09-07 (×2): 100 mg via ORAL
  Filled 2015-09-06 (×2): qty 1

## 2015-09-06 MED ORDER — MIDAZOLAM HCL 2 MG/2ML IJ SOLN
INTRAMUSCULAR | Status: AC
  Filled 2015-09-06: qty 2

## 2015-09-06 MED ORDER — LACTATED RINGERS IV SOLN
INTRAVENOUS | Status: DC
  Administered 2015-09-06: 16:00:00 via INTRAVENOUS

## 2015-09-06 MED ORDER — LIDOCAINE HCL (CARDIAC) 20 MG/ML IV SOLN
INTRAVENOUS | Status: DC | PRN
  Administered 2015-09-06: 60 mg via INTRAVENOUS

## 2015-09-06 MED ORDER — DEXAMETHASONE SODIUM PHOSPHATE 10 MG/ML IJ SOLN
INTRAMUSCULAR | Status: DC | PRN
  Administered 2015-09-06: 10 mg via INTRAVENOUS

## 2015-09-06 MED ORDER — BUPIVACAINE-EPINEPHRINE (PF) 0.25% -1:200000 IJ SOLN
INTRAMUSCULAR | Status: AC
  Filled 2015-09-06: qty 30

## 2015-09-06 MED ORDER — SODIUM CHLORIDE 0.9 % IJ SOLN
3.0000 mL | Freq: Two times a day (BID) | INTRAMUSCULAR | Status: DC
  Administered 2015-09-07: 3 mL via INTRAVENOUS

## 2015-09-06 MED ORDER — THROMBIN 5000 UNITS EX SOLR
CUTANEOUS | Status: DC | PRN
  Administered 2015-09-06: 5000 [IU] via TOPICAL

## 2015-09-06 MED ORDER — HYDROMORPHONE HCL 1 MG/ML IJ SOLN
INTRAMUSCULAR | Status: DC | PRN
  Administered 2015-09-06: .1 mg via INTRAVENOUS
  Administered 2015-09-06: .2 mg via INTRAVENOUS
  Administered 2015-09-06: .25 mg via INTRAVENOUS
  Administered 2015-09-06: .2 mg via INTRAVENOUS
  Administered 2015-09-06: .25 mg via INTRAVENOUS

## 2015-09-06 MED ORDER — EPHEDRINE SULFATE 50 MG/ML IJ SOLN
INTRAMUSCULAR | Status: AC
  Filled 2015-09-06: qty 1

## 2015-09-06 MED ORDER — ARTIFICIAL TEARS OP OINT
TOPICAL_OINTMENT | OPHTHALMIC | Status: AC
  Filled 2015-09-06: qty 3.5

## 2015-09-06 MED ORDER — MENTHOL 3 MG MT LOZG: 1.0000 | LOZENGE | OROMUCOSAL | Status: DC | PRN

## 2015-09-06 MED ORDER — METOPROLOL TARTRATE 1 MG/ML IV SOLN
INTRAVENOUS | Status: DC | PRN
  Administered 2015-09-06 (×5): 1 mg via INTRAVENOUS

## 2015-09-06 MED ORDER — MORPHINE SULFATE (PF) 2 MG/ML IV SOLN: 1.0000 mg | INTRAVENOUS | Status: DC | PRN

## 2015-09-06 MED ORDER — GLYCOPYRROLATE 0.2 MG/ML IJ SOLN
INTRAMUSCULAR | Status: DC | PRN
  Administered 2015-09-06: 0.2 mg via INTRAVENOUS

## 2015-09-06 MED ORDER — SODIUM CHLORIDE 0.9 % IV SOLN: 250.0000 mL | INTRAVENOUS | Status: DC

## 2015-09-06 MED ORDER — DEXAMETHASONE SODIUM PHOSPHATE 10 MG/ML IJ SOLN
INTRAMUSCULAR | Status: AC
  Filled 2015-09-06: qty 1

## 2015-09-06 MED ORDER — EPHEDRINE SULFATE 50 MG/ML IJ SOLN
INTRAMUSCULAR | Status: DC | PRN
  Administered 2015-09-06 (×3): 10 mg via INTRAVENOUS

## 2015-09-06 MED ORDER — LIDOCAINE HCL (CARDIAC) 20 MG/ML IV SOLN
INTRAVENOUS | Status: AC
  Filled 2015-09-06: qty 5

## 2015-09-06 MED ORDER — HYDROMORPHONE HCL 1 MG/ML IJ SOLN
INTRAMUSCULAR | Status: AC
  Filled 2015-09-06: qty 1

## 2015-09-06 MED ORDER — MAGNESIUM CITRATE PO SOLN: 0.5000 | Freq: Once | ORAL | Status: DC

## 2015-09-06 MED ORDER — ONDANSETRON HCL 4 MG PO TABS: 4.0000 mg | ORAL_TABLET | Freq: Three times a day (TID) | ORAL | Status: AC | PRN

## 2015-09-06 SURGICAL SUPPLY — 57 items
BNDG GAUZE ELAST 4 BULKY (GAUZE/BANDAGES/DRESSINGS) ×2 IMPLANT
BUR EGG ELITE 4.0 (BURR) IMPLANT
CORDS BIPOLAR (ELECTRODE) ×2 IMPLANT
DRAPE C-ARM 42X72 X-RAY (DRAPES) IMPLANT
DRAPE POUCH INSTRU U-SHP 10X18 (DRAPES) ×2 IMPLANT
DRAPE SURG 17X11 SM STRL (DRAPES) ×2 IMPLANT
DRAPE U-SHAPE 47X51 STRL (DRAPES) ×2 IMPLANT
DRSG MEPILEX BORDER 4X4 (GAUZE/BANDAGES/DRESSINGS) IMPLANT
DRSG MEPILEX BORDER 4X8 (GAUZE/BANDAGES/DRESSINGS) ×2 IMPLANT
DURAPREP 26ML APPLICATOR (WOUND CARE) ×2 IMPLANT
ELECT BLADE 4.0 EZ CLEAN MEGAD (MISCELLANEOUS) ×2
ELECT CAUTERY BLADE 6.4 (BLADE) ×2 IMPLANT
ELECT PENCIL ROCKER SW 15FT (MISCELLANEOUS) ×2 IMPLANT
ELECT REM PT RETURN 9FT ADLT (ELECTROSURGICAL) ×2
ELECTRODE BLDE 4.0 EZ CLN MEGD (MISCELLANEOUS) ×1 IMPLANT
ELECTRODE REM PT RTRN 9FT ADLT (ELECTROSURGICAL) ×1 IMPLANT
EVACUATOR 1/8 PVC DRAIN (DRAIN) ×2 IMPLANT
FLOSEAL (HEMOSTASIS) IMPLANT
GLOVE BIOGEL PI IND STRL 8 (GLOVE) ×1 IMPLANT
GLOVE BIOGEL PI IND STRL 8.5 (GLOVE) ×1 IMPLANT
GLOVE BIOGEL PI INDICATOR 8 (GLOVE) ×1
GLOVE BIOGEL PI INDICATOR 8.5 (GLOVE) ×1
GLOVE ORTHO TXT STRL SZ7.5 (GLOVE) ×2 IMPLANT
GLOVE SS BIOGEL STRL SZ 8.5 (GLOVE) ×1 IMPLANT
GLOVE SUPERSENSE BIOGEL SZ 8.5 (GLOVE) ×1
GLOVE SURG SS PI 6.5 STRL IVOR (GLOVE) ×2 IMPLANT
GOWN STRL REUS W/ TWL LRG LVL3 (GOWN DISPOSABLE) ×1 IMPLANT
GOWN STRL REUS W/TWL 2XL LVL3 (GOWN DISPOSABLE) ×4 IMPLANT
GOWN STRL REUS W/TWL LRG LVL3 (GOWN DISPOSABLE) ×1
KIT BASIN OR (CUSTOM PROCEDURE TRAY) ×2 IMPLANT
NEEDLE 22X1 1/2 (OR ONLY) (NEEDLE) ×2 IMPLANT
NEEDLE SPNL 18GX3.5 QUINCKE PK (NEEDLE) ×4 IMPLANT
NS IRRIG 1000ML POUR BTL (IV SOLUTION) ×2 IMPLANT
PACK LAMINECTOMY ORTHO (CUSTOM PROCEDURE TRAY) ×2 IMPLANT
PACK UNIVERSAL I (CUSTOM PROCEDURE TRAY) ×2 IMPLANT
PATTIES SURGICAL .5 X.5 (GAUZE/BANDAGES/DRESSINGS) IMPLANT
PATTIES SURGICAL .5 X1 (DISPOSABLE) ×2 IMPLANT
SPONGE LAP 4X18 X RAY DECT (DISPOSABLE) ×6 IMPLANT
SPONGE SURGIFOAM ABS GEL 100 (HEMOSTASIS) ×2 IMPLANT
STAPLER VISISTAT 35W (STAPLE) IMPLANT
STRIP CLOSURE SKIN 1/2X4 (GAUZE/BANDAGES/DRESSINGS) ×2 IMPLANT
SURGIFLO W/THROMBIN 8M KIT (HEMOSTASIS) ×2 IMPLANT
SUT BONE WAX W31G (SUTURE) ×2 IMPLANT
SUT MON AB 3-0 SH 27 (SUTURE) ×1
SUT MON AB 3-0 SH27 (SUTURE) ×1 IMPLANT
SUT VIC AB 1 CT1 18XCR BRD 8 (SUTURE) ×1 IMPLANT
SUT VIC AB 1 CT1 27 (SUTURE) ×2
SUT VIC AB 1 CT1 27XBRD ANTBC (SUTURE) ×2 IMPLANT
SUT VIC AB 1 CT1 8-18 (SUTURE) ×1
SUT VIC AB 2-0 CT1 18 (SUTURE) ×6 IMPLANT
SUT VICRYL 0 UR6 27IN ABS (SUTURE) ×4 IMPLANT
SYR BULB IRRIGATION 50ML (SYRINGE) ×2 IMPLANT
SYR CONTROL 10ML LL (SYRINGE) ×2 IMPLANT
TOWEL OR 17X26 10 PK STRL BLUE (TOWEL DISPOSABLE) ×4 IMPLANT
TRAY FOLEY CATH 16FRSI W/METER (SET/KITS/TRAYS/PACK) IMPLANT
WATER STERILE IRR 1000ML POUR (IV SOLUTION) IMPLANT
YANKAUER SUCT BULB TIP NO VENT (SUCTIONS) ×2 IMPLANT

## 2015-09-06 NOTE — Brief Op Note (Signed)
09/06/2015  12:34 PM  PATIENT:  Anthony Andersen  74 y.o. male  PRE-OPERATIVE DIAGNOSIS:  T10 STENOSIS WITH MYOLAPATHY  POST-OPERATIVE DIAGNOSIS:  T10 STENOSIS WITH MYOLAPATHY  PROCEDURE:  Procedure(s): Thorasic nine-eleven DECOMPRESSION (N/A)  SURGEON:  Surgeon(s) and Role:    * Venita Lickahari Shadana Pry, MD - Primary  PHYSICIAN ASSISTANT:   ASSISTANTS: Carmen Mayo   ANESTHESIA:   general  EBL:  Total I/O In: 2000 [I.V.:2000] Out: 800 [Urine:625; Blood:175]  BLOOD ADMINISTERED:none  DRAINS: 1 hemovac in the back   LOCAL MEDICATIONS USED:  MARCAINE     SPECIMEN:  No Specimen  DISPOSITION OF SPECIMEN:  N/A  COUNTS:  YES  TOURNIQUET:  * No tourniquets in log *  DICTATION: .Other Dictation: Dictation Number [id number]  PLAN OF CARE: Admit to inpatient   PATIENT DISPOSITION:  PACU - hemodynamically stable.

## 2015-09-06 NOTE — Anesthesia Postprocedure Evaluation (Signed)
Anesthesia Post Note  Patient: Anthony AmassJohn L Andersen  Procedure(s) Performed: Procedure(s) (LRB): Thorasic nine-eleven DECOMPRESSION (N/A)  Patient location during evaluation: PACU Anesthesia Type: General Level of consciousness: awake and alert Pain management: pain level controlled Vital Signs Assessment: post-procedure vital signs reviewed and stable Respiratory status: spontaneous breathing, nonlabored ventilation, respiratory function stable and patient connected to nasal cannula oxygen Cardiovascular status: blood pressure returned to baseline and stable Postop Assessment: no signs of nausea or vomiting Anesthetic complications: no    Last Vitals:  Filed Vitals:   09/06/15 1345 09/06/15 1405  BP: 121/75 116/79  Pulse: 99 99  Temp:  36.3 C  Resp: 17 14    Last Pain:  Filed Vitals:   09/06/15 1415  PainSc: 0-No pain    LLE Motor Response: Purposeful movement;Responds to commands (09/06/15 1415) LLE Sensation: No numbness (09/06/15 1415) RLE Motor Response: Purposeful movement;Responds to commands (09/06/15 1415) RLE Sensation: No numbness (09/06/15 1415)      Melynda Krzywicki,W. EDMOND

## 2015-09-06 NOTE — Anesthesia Procedure Notes (Signed)
Procedure Name: Intubation Date/Time: 09/06/2015 8:43 AM Performed by: Salomon MastWALL, Angelis Gates COREY Pre-anesthesia Checklist: Patient identified, Emergency Drugs available, Suction available and Patient being monitored Patient Re-evaluated:Patient Re-evaluated prior to inductionOxygen Delivery Method: Circle system utilized Preoxygenation: Pre-oxygenation with 100% oxygen Intubation Type: IV induction Ventilation: Mask ventilation without difficulty Laryngoscope Size: Mac and 3 Grade View: Grade III Tube type: Oral Tube size: 7.5 mm Number of attempts: 1 Airway Equipment and Method: Stylet Placement Confirmation: ETT inserted through vocal cords under direct vision,  positive ETCO2 and breath sounds checked- equal and bilateral Secured at: 23 cm Dental Injury: Teeth and Oropharynx as per pre-operative assessment

## 2015-09-06 NOTE — Transfer of Care (Signed)
Immediate Anesthesia Transfer of Care Note  Patient: Anthony AmassJohn L Vine  Procedure(s) Performed: Procedure(s): Thorasic nine-eleven DECOMPRESSION (N/A)  Patient Location: PACU  Anesthesia Type:General  Level of Consciousness: awake, alert , oriented, patient cooperative and responds to stimulation  Airway & Oxygen Therapy: Patient Spontanous Breathing and Patient connected to face mask oxygen  Post-op Assessment: Report given to RN, Post -op Vital signs reviewed and stable, Patient moving all extremities X 4 and Patient able to stick tongue midline  Post vital signs: Reviewed and stable  Last Vitals:  Filed Vitals:   09/06/15 0633 09/06/15 1314  BP: 153/87 130/79  Pulse: 92 101  Temp: 36.7 C 36.3 C  Resp: 20 14    Complications: No apparent anesthesia complications

## 2015-09-06 NOTE — H&P (Signed)
History of Present Illness The patient is a 74 year old male who presents with neck pain. The patient is seen today in referral from Dr Ethelene Halamos. The patient reports symptoms involving the cervical and thoracic spine which began year(s) ago. The symptoms began without any known injury. Symptoms include neck pain, impaired range of motion, shoulder pain (left , treated by Dr Ranell PatrickNorris), numbness (left trapezius) and tingling (left arm). There is no radiation. The patient describes the pain as aching.The patient describes their symptoms as moderate in severity (5/10).The patient does feel that the symptoms are unchanged. Symptoms are exacerbated by turning the head to the right and turning the head to the left. Associated symptoms include upper extremity paresthesias (left arm). Current treatment includes opioid analgesics (Hydrocodone 10-325 mg, Tizanidine, Neurontin). Pertinent medical history includes low back pain ("I've had 2 back surgeries by Dr Shelle IronBeane"). Prior to being seen today the patient was previously evaluated by a colleague (Dr Ranell PatrickNorris for shoulder left). Past evaluation has included cervical spine MRI (@ [facility] 05-26-15). Past treatment has included opioid analgesics, physical therapy and spinal injections (in the past did not help).  Additional reasons for visit:  Back pain is described as the following: The patient is here today in referral from Dr Ethelene Halamos. The patient reports mid back (on the left and into the anterior rib cage) symptoms including pain and tightness which began 3 month(s) ago without any known injury. and Symptoms include pain (left leg to the ankle), tingling (left leg) and stiffness. The pain radiates to the left foot. The patient describes the pain as tingling. The patient describes the severity of their symptoms as 9 / 10 on an analog pain scale (increases with twisting). The patient feels as if the symptoms are worsening. Symptoms are exacerbated by bending. Current treatment includes  opioid analgesics and muscle relaxants. Past treatment has included TENS unit (did not help), ice packs, heating pad and back surgery (low back by Dr Shelle IronBeane x 2).  Transition into care is described as the following: The patient is transitioning into care and a summary of care was reviewed .  Subjective Transcription ADDENDUM I obtained Dr. Trevor [facility]Ahern's [facility]. Her clinical exam demonstrates nonfocal findings. No evidence of any significant cervical myelopathy. Given the fact that there is nothing significant that she found on her exam, I think the best course of action is proceeding just with the thoracic decompression.  Allergies  No Known Allergies 05/09/2011  Family History Diabetes Mellitus  First Degree Relatives. mother Hypertension  mother Cancer  mother, father and grandfather mothers side  Social History  Pain Contract  no Tobacco / smoke exposure  no Number of flights of stairs before winded  1 Marital status  married Tobacco use  Never smoker. never smoker Living situation  live with spouse Exercise  Exercises weekly; does running / walking Drug/Alcohol Rehab (Previously)  no Drug/Alcohol Rehab (Currently)  no Alcohol use  Occasional alcohol use. current drinker; only occasionally per week Illicit drug use  yes Current work status  working full time Children  1  Medication History  Neurontin (300MG  Capsule, 2 (two) Oral three times daily, Taken starting 07/17/2015) Active. Norco (10-325MG  Tablet, 1 (one) Oral four times daily, as needed, Taken starting 08/09/2015) Active. (tid- qid prn Rx'd by Dr Ethelene Halamos) TiZANidine HCl (4MG  Tablet, 1 (one) Oral four times daily, as needed, Taken starting 05/18/2015) Active. Diclofenac Sodium (75MG  Tablet DR, Oral) Active. Bayer Aspirin EC Low Dose (81MG  Tablet DR, Oral)  Active. Zetia (  Tablet, Oral) Active. (qd) Medications Reconciled  Other Problems  Asthma  Chronic Pain   High blood pressure  Hypercholesterolemia   Objective Transcription He has got positive upgoing toes and Babinski testing and 3 beats of clonus, brisk deep tendon reflexes at the knee and Achilles. He has difficulty maintaining his balance with a shuffling gait pattern and horrific mid thoracic pain. He has his baseline neck pain, which really has not significantly changed. He has got 5/5 strength in the upper extremity, negative Hoffmann's sign, negative inverted brachioradialis reflex, 1+ upper extremity deep tendon reflexes. At this point in time, his thoracic MRI from 08/03/2015 was evaluated. He has got an area of compressive edema in the cord at T10-11 due to severe facet arthrosis causing severe canal stenosis with cord compression. He has got mild disc protrusions at T5-6, T6-7, T7-8, but again the maximum area of compression is at T10-11 with cord signal changes at that level. I have reviewed the rheumatology note from 06/22/2015. 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 There is pannus formation at C1-2, but it does not appear to have a solid fusion. At this point, I do not think his myelopathic findings are due to cervical pannus.  The MRI does not show cord compression or cord signal changes at that level and his upper extremity neurological exam is not consistent with cervical myelopathy. His lower extremity neurological picture is consistent with this thoracic disease. He has also had over the last month horrific increase in this thoracic pain.   Assessment & Plan  Thoracic spine pain  Thoracic spondylosis with myelopathy   Plans Transcription At this point, we are going to tentatively schedule a T10 laminectomy to address the cord compression at this level.  We have gone over the risks of thoracic procedure, which include infection, bleeding, nerve damage, death, stroke,  paralysis, failure to heel, need for further surgery, ongoing or worse pain. The goal of that operation is to first to alleviate the cord compression and prevent worsening of his myelopathy. Secondary goal is to get improvement of his spinal cord function. Tertiary goal is improvement in his overall pain in the thoracic region. Both he and his wife were present for this dictation and they have expressed their understanding of the surgical plan.   Also discussed possibility of fusion with or without instrumentation if the extent of the decompression would lead to instability.

## 2015-09-06 NOTE — Anesthesia Preprocedure Evaluation (Addendum)
Anesthesia Evaluation  Patient identified by MRN, date of birth, ID band Patient awake    Reviewed: Allergy & Precautions, H&P , NPO status , Patient's Chart, lab work & pertinent test results  Airway Mallampati: III  TM Distance: >3 FB Neck ROM: Full    Dental no notable dental hx. (+) Teeth Intact, Dental Advisory Given   Pulmonary neg pulmonary ROS,    Pulmonary exam normal breath sounds clear to auscultation       Cardiovascular hypertension,  Rhythm:Regular Rate:Normal     Neuro/Psych  Headaches, Anxiety negative psych ROS   GI/Hepatic negative GI ROS, Neg liver ROS,   Endo/Other  negative endocrine ROS  Renal/GU negative Renal ROS  negative genitourinary   Musculoskeletal  (+) Arthritis , Osteoarthritis,    Abdominal   Peds  Hematology negative hematology ROS (+)   Anesthesia Other Findings   Reproductive/Obstetrics negative OB ROS                            Anesthesia Physical Anesthesia Plan  ASA: II  Anesthesia Plan: General   Post-op Pain Management:    Induction: Intravenous  Airway Management Planned: Oral ETT  Additional Equipment:   Intra-op Plan:   Post-operative Plan: Extubation in OR  Informed Consent: I have reviewed the patients History and Physical, chart, labs and discussed the procedure including the risks, benefits and alternatives for the proposed anesthesia with the patient or authorized representative who has indicated his/her understanding and acceptance.   Dental advisory given  Plan Discussed with: CRNA  Anesthesia Plan Comments:         Anesthesia Quick Evaluation

## 2015-09-06 NOTE — Progress Notes (Signed)
Patient arrived to [id number]. Patient is alert and oriented x4, neuro assessment intact, within defined limits. States pain is "not bad, 4/10" in back. Mepilex dressing on incision is clean, dry, and intact. Hemovac intact, bloody drainage. Per MD note, no brace needed. Patient states his last bowel movement was 09/05/15, bowel sounds currently hypoactive, not currently passing flatus. Patient ambulated 150 feet with RN and NT using rolling walker. Patient tolerated well. Patient oriented to room, unit, staff. Safety measures in place, patient aware of fall prevention policy. Will continue to monitor.

## 2015-09-06 NOTE — Op Note (Signed)
NAMEMarland Kitchen  Anthony Andersen, Anthony Andersen NO.:  [id number]  MEDICAL RECORD NO.:  [id number]  LOCATION:  5C17C                        FACILITY:  [facility]  PHYSICIAN:  Jaskirat Zertuche D. Shon Baton, M.D. DATE OF BIRTH:  [birth year]  DATE OF PROCEDURE:  09/06/2015 DATE OF DISCHARGE:                              OPERATIVE REPORT   PREOPERATIVE DIAGNOSIS:  Thoracic spinal stenosis, T10-11 with myelopathy.  POSTOPERATIVE DIAGNOSIS:  Thoracic spinal stenosis, T10-11 with myelopathy.  OPERATIVE PROCEDURE:  T9-T11 decompression.  COMPLICATIONS:  None.  CONDITION:  Stable.  HISTORY:  This is a very pleasant 74 year old male who has been having progressive balance issues, lower extremity weakness and fatigue and difficulty ambulating.  Imaging studies demonstrated a thoracic spinal stenosis causing significant cord compression with cord signal changes. After a thorough workup including Neurology and Rheumatology evaluation, we elected to proceed with a thoracic decompression for the aforementioned problem.  All appropriate risks, benefits, and alternatives were discussed with the patient and consent was obtained.  OPERATIVE NOTE:  The patient was brought to the operating room and placed supine on the operating table.  After successful induction of general anesthesia and endotracheal intubation, TEDs, SCDs, and Foley were inserted.  The patient was turned prone onto the Wilson frame.  All bony prominences were well padded, and the back was prepped and draped in a standard fashion.  Time-out was taken confirming the patient, procedure and all other pertinent important data.  Once this was completed, x-ray was used to identify the T11 pedicle.  I confirmed positioning, mapped out my incision site, and infiltrated with 0.25% Marcaine.  Midline incision was made.  Sharp dissection was carried out down to the deep fascia.  I exposed the T9, T10, T11 spinous processes, lamina and facet capsules.  Once this  was done bilaterally using Cobb and Bovie, I then placed self-retaining retractors and marked the spinous process at the level of the T11 pedicle.  X-ray confirmed, counting up from the L5 level that I was at the T11 pedicle.  At this point, I removed all of the T10 spinous process, the majority of the T9 and portion of the T11 as well.  This was done with a Leksell rongeur. Once I had this done, I carried out my decompression at the T9-T10 level.  Since this was cranial to the area of maximal stenosis, I elected to proceed here so I could identify my level of the thoracic cord.  Using 2-mm and 1-mm Kerrison punch as well as neuro curettes, I dissected down to the T9 lamina and used a 2 and 1-mm Kerrison to perform a generous laminotomy.  I then identified the central raphe of the ligamentum flavum and used my 1-mm Kerrison to began resecting the ligamentum flavum.  I expanded this with the 2 until I had an adequate visualization of the thoracic spine.  I then started to work inferiorly removing the superior portion of the T10 lamina.  This was done with great ease.  There was no significant compression.  Once I had an adequate T9-10 central decompression, I then went to the T10-11 level. I then again removed the ligamentum flavum and began looking for an  area underneath the T10 lamina, this was quite difficult due to the overflowing large osteophytes.  I eventually did develop a plane and began resecting the lamina laterally starting at the inferior aspect of T10.  I did this bilaterally.  Care was taken not to do much central because this was the area of maximum compression from the spinal stenosis.  I eventually was able to work from cranial and caudal in the lateral recess until I had completed my T10 laminotomy bilaterally. This allowed me to gently resect the central port from the T10 lamina to complete my laminectomy.  Once this was done, I could then visualize the very large  lateral recess osteophytes that were still causing compression of the thecal sac.  In order to address this, I used my 1-mm Kerrison and began dissecting laterally, removing the lateral portion of the ligamentum flavum.  Care was taken not to compress, retract or contact the spinal cord.  Once I had worked out the lateral recess, I was able to gently decompress the remaining portion of the ligamentum flavum and bony lateral recess into the lateral cavity that I created and resect this.  Once this was done bilaterally, I had an excellent central and lateral recess decompression of the thecal sac.  It was expanded to the appropriate width.  I could visualize the medial borders of the T10 pedicle and T11 pedicle and the thecal sac was expanding into the entire space for the canal.  At this point, I was very pleased with my overall decompression.  I then took another x-rays confirming that I had decompressed from the inferior aspect of the T11 pedicle to the inferior aspect of the T9 pedicle.  This corresponded with the area of maximum compression of the thecal sac at the T10-11 disk space itself. At this point, I irrigated the wound copiously with normal saline and used bipolar electrocautery and FloSeal to obtain and maintain hemostasis.  Once this was done, I irrigated out the FloSeal and then placed a thrombin-soaked Gelfoam patty over the exposed thecal sac and then placed a deep drain.  I closed the deep wound with interrupted #1 Vicryl sutures, then a running 0 Vicryl suture, interrupted 2-0 Vicryl sutures and a 3-0 Monocryl for the skin.  Steri-Strips and dry dressing were applied.  The patient was ultimately extubated, transferred to the PACU without incident.  At the end of the case, all needle and sponge counts were correct.  There were no adverse intraoperative events.  My first assistant was [facility] Hospital[facility] Mayo, my [place].     Anthony Andersen D. Shon BatonBrooks, M.D.     DDB/MEDQ  D:  09/06/2015  T:   09/06/2015  Job:  [id number]

## 2015-09-07 ENCOUNTER — Encounter (HOSPITAL_COMMUNITY): Payer: Self-pay | Admitting: Orthopedic Surgery

## 2015-09-07 DIAGNOSIS — M5134 Other intervertebral disc degeneration, thoracic region: Secondary | ICD-10-CM | POA: Diagnosis present

## 2015-09-07 DIAGNOSIS — M4804 Spinal stenosis, thoracic region: Secondary | ICD-10-CM | POA: Diagnosis not present

## 2015-09-07 MED ORDER — MAGNESIUM CITRATE PO SOLN
1.0000 | Freq: Once | ORAL | Status: AC
  Administered 2015-09-07: 1 via ORAL
  Filled 2015-09-07: qty 296

## 2015-09-07 MED FILL — Thrombin For Soln 20000 Unit: CUTANEOUS | Qty: 1 | Status: AC

## 2015-09-07 NOTE — Progress Notes (Signed)
discharged to home via wheelchair

## 2015-09-07 NOTE — Progress Notes (Signed)
Occupational Therapy Evaluation/Discharge Patient Details Name: Anthony Andersen MRN: [id number] DOB: April 05, 1941 Today's Date: 09/07/2015    History of Present Illness Patient is a 74 y/o male s/p T9-11 decompression. PMH includes HTN, anxiety, multiple back surgeries.   Clinical Impression   PTA, pt was independent with all ADLs and functional mobility. Pt currently presents with acute back pain and required supervision for all mobility and ADL tasks. Reviewed back precautions, compensatory strategies for ADLs, and fall prevention strategies with pt and wife. All education completed and pt has no further questions. Pt has no further acute OT needs. OT signing off. Thank you for this referral.    Follow Up Recommendations  No OT follow up;Supervision - Intermittent    Equipment Recommendations  None recommended by OT    Recommendations for Other Services       Precautions / Restrictions Precautions Precautions: Back Precaution Booklet Issued: Yes (comment) Precaution Comments: Pt able to recall 1/3 back precautions at beginning of session. Reviewed precautions pt able to recall 3/3 at end of session Restrictions Weight Bearing Restrictions: No      Mobility Bed Mobility Overal bed mobility: Needs Assistance Bed Mobility: Rolling;Sidelying to Sit;Sit to Sidelying Rolling: Supervision Sidelying to sit: Min assist     Sit to sidelying: Supervision General bed mobility comments: Pt up in chair on OT arrival.   Transfers Overall transfer level: Needs assistance Equipment used: Rolling walker (2 wheeled) Transfers: Sit to/from Stand Sit to Stand: Supervision         General transfer comment: Supervision for safety. Stood from chair x1, from shower seat x1, and from toilet x1 with vebal cues for safe hand placement on seated surfaces and to avoid bending.    Balance Overall balance assessment: Needs assistance Sitting-balance support: No upper extremity supported;Feet  supported Sitting balance-Leahy Scale: Good     Standing balance support: Bilateral upper extremity supported;During functional activity Standing balance-Leahy Scale: Fair Standing balance comment: Able to stand and complete ADL tasks without UE support with no LOB                            ADL Overall ADL's : Needs assistance/impaired     Grooming: Wash/dry hands;Supervision/safety;Standing           Upper Body Dressing : Modified independent;Sitting   Lower Body Dressing: Supervision/safety;Cueing for compensatory techniques;Sitting/lateral leans;Sit to/from stand Lower Body Dressing Details (indicate cue type and reason): Verbal cues to cross ankle-over-knee for LB ADLs Toilet Transfer: Supervision/safety;Ambulation;Regular Toilet;Grab bars;RW   Toileting- Clothing Manipulation and Hygiene: Supervision/safety;Cueing for back precautions;Sit to/from stand Toileting - Clothing Manipulation Details (indicate cue type and reason): Verbal cues to avoid bending Tub/ Shower Transfer: Walk-in shower;Supervision/safety;Ambulation;Shower seat;Grab bars;Rolling walker   Functional mobility during ADLs: Supervision/safety;Rolling walker General ADL Comments: Supervision and min verbal cues for adherence to back precautions during all mobility and ADL tasks. No AE required - pt can cross ankle-over-knee for LB ADLs. Reviewed back precautions, compensatory strategies and fall prevention strategies with pt and wife.      Vision Vision Assessment?: Yes Eye Alignment: Impaired (comment) (L eye medially positioned at rest) Ocular Range of Motion: Within Functional Limits Alignment/Gaze Preference: Within Defined Limits Tracking/Visual Pursuits: Requires cues, head turns, or add eye shifts to track;Decreased smoothness of horizontal tracking;Decreased smoothness of vertical tracking Convergence: Impaired (comment) (Restricted on L side) Visual Fields: No apparent deficits    Perception     Praxis  Pertinent Vitals/Pain Pain Assessment: 0-10 Pain Score: 5  Faces Pain Scale: Hurts little more Pain Location: back Pain Descriptors / Indicators: Sore Pain Intervention(s): Limited activity within patient's tolerance;Monitored during session;Repositioned;RN gave pain meds during session     Hand Dominance Right   Extremity/Trunk Assessment Upper Extremity Assessment Upper Extremity Assessment: Overall WFL for tasks assessed   Lower Extremity Assessment Lower Extremity Assessment: Overall WFL for tasks assessed   Cervical / Trunk Assessment Cervical / Trunk Assessment: Kyphotic   Communication Communication Communication: No difficulties   Cognition Arousal/Alertness: Awake/alert Behavior During Therapy: WFL for tasks assessed/performed Overall Cognitive Status: Within Functional Limits for tasks assessed       Memory: Decreased recall of precautions             General Comments       Exercises       Shoulder Instructions      Home Living Family/patient expects to be discharged to:: Private residence Living Arrangements: Spouse/significant other Available Help at Discharge: Family;Available 24 hours/day Type of Home: House Home Access: Ramped entrance     Home Layout: Two level;Able to live on main level with bedroom/bathroom     Bathroom Shower/Tub: Walk-in shower;Door   Bathroom Toilet: Handicapped height     Home Equipment: Environmental consultantWalker - 2 wheels;Cane - single point;Grab bars - tub/shower          Prior Functioning/Environment Level of Independence: Independent        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    OT Diagnosis: Acute pain   OT Problem List: Decreased strength;Impaired balance (sitting and/or standing);Decreased range of motion;Decreased activity tolerance;Decreased coordination;Decreased safety awareness;Decreased knowledge of use of DME or AE;Decreased knowledge of  precautions;Pain   OT Treatment/Interventions:      OT Goals(Current goals can be found in the care plan section) Acute Rehab OT Goals Patient Stated Goal: to go home OT Goal Formulation: With patient Time For Goal Achievement: 09/21/15 Potential to Achieve Goals: Good  OT Frequency:     Barriers to D/C:            Co-evaluation              End of Session Equipment Utilized During Treatment: Gait belt;Rolling walker Nurse Communication: Mobility status;Precautions  Activity Tolerance: Patient tolerated treatment well Patient left: in chair;with call bell/phone within reach;with chair alarm set;with family/visitor present   Time: 1031-1055 OT Time Calculation (min): 24 min Charges:  OT General Charges $OT Visit: 1 Procedure OT Evaluation $Initial OT Evaluation Tier I: 1 Procedure OT Treatments $Self Care/Home Management : 8-22 mins G-Codes:    Nils PyleJulia Valda Christenson, OTR/L 09/07/2015, 11:11 AM

## 2015-09-07 NOTE — Progress Notes (Signed)
Pt in chair several hours at start of shift, ambulated on both units 5C and 58M, back into chair 0500.

## 2015-09-07 NOTE — Evaluation (Signed)
Physical Therapy Evaluation Patient Details Name: Anthony AmassJohn L Lamia MRN: [id number] DOB: 1941/02/24 Today's Date: 09/07/2015   History of Present Illness  Patient is a 74 y/o male s/p T9-11 decompression. PMH includes HTN, anxiety, multiple back surgeries.  Clinical Impression  Patient presents with pain and post surgical deficits s/p back surgery. Education provided on back precautions. Tolerated transfers and ambulation with supervision progressing to Mod I for safety. Practiced log roll technique for bed mobility. Wife present for session. Pt will have support from wife at d/c. All education completed as pt plans to discharge today. Discharge from therapy.    Follow Up Recommendations No PT follow up;Supervision for mobility/OOB    Equipment Recommendations  None recommended by PT    Recommendations for Other Services       Precautions / Restrictions Precautions Precautions: Back Precaution Booklet Issued: No Precaution Comments: Reviewed 3/3 back precautions. Restrictions Weight Bearing Restrictions: No      Mobility  Bed Mobility Overal bed mobility: Needs Assistance Bed Mobility: Rolling;Sidelying to Sit;Sit to Sidelying Rolling: Supervision Sidelying to sit: Min assist     Sit to sidelying: Supervision General bed mobility comments: HOB flat, no use of rails to simulate home. Pt's bed adjustable at home. Cues for log roll technique. performed x2. Min A to elevate trunk.  Transfers Overall transfer level: Needs assistance Equipment used: Rolling walker (2 wheeled) Transfers: Sit to/from Stand Sit to Stand: Supervision         General transfer comment: Supervision for safety. Stood from chair x1, from EOB x1- good technique. Transferred back to chair post ambulation bout.  Ambulation/Gait Ambulation/Gait assistance: Supervision;Modified independent (Device/Increase time) Ambulation Distance (Feet): 250 Feet Assistive device: Rolling walker (2 wheeled) Gait  Pattern/deviations: Step-through pattern;Decreased stride length Gait velocity: decreased Gait velocity interpretation: <1.8 ft/sec, indicative of risk for recurrent falls General Gait Details: Slow, steady gait. Cues for upright posture.   Stairs            Wheelchair Mobility    Modified Rankin (Stroke Patients Only)       Balance Overall balance assessment: Needs assistance Sitting-balance support: Feet supported;No upper extremity supported Sitting balance-Leahy Scale: Good     Standing balance support: During functional activity Standing balance-Leahy Scale: Fair Standing balance comment: Able to walk safely within room wihtout UE support short distances.                             Pertinent Vitals/Pain Pain Assessment: Faces Faces Pain Scale: Hurts little more Pain Location: back at surgical site Pain Descriptors / Indicators: Sore;Operative site guarding Pain Intervention(s): Monitored during session;Repositioned    Home Living Family/patient expects to be discharged to:: Private residence Living Arrangements: Spouse/significant other Available Help at Discharge: Family;Available 24 hours/day Type of Home: House Home Access: Ramped entrance     Home Layout: Two level;Able to live on main level with bedroom/bathroom Home Equipment: Dan HumphreysWalker - 2 wheels;Cane - single point      Prior Function Level of Independence: Independent         Comments: Still works- runs rental buildings.     Hand Dominance        Extremity/Trunk Assessment   Upper Extremity Assessment: Defer to OT evaluation           Lower Extremity Assessment: Overall WFL for tasks assessed         Communication   Communication: No difficulties  Cognition Arousal/Alertness: Awake/alert Behavior During  Therapy: WFL for tasks assessed/performed Overall Cognitive Status: Within Functional Limits for tasks assessed                      General Comments  General comments (skin integrity, edema, etc.): Wife present towards end of session.    Exercises        Assessment/Plan    PT Assessment Patent does not need any further PT services  PT Diagnosis Acute pain   PT Problem List Decreased mobility;Decreased balance;Decreased activity tolerance;Decreased knowledge of precautions  PT Treatment Interventions     PT Goals (Current goals can be found in the Care Plan section) Acute Rehab PT Goals PT Goal Formulation: All assessment and education complete, DC therapy    Frequency Min 5X/week   Barriers to discharge        Co-evaluation               End of Session Equipment Utilized During Treatment: Gait belt Activity Tolerance: Patient tolerated treatment well Patient left: with family/visitor present;in chair;with call bell/phone within reach;with chair alarm set Nurse Communication: Mobility status;Precautions         Time: 3086-5784 PT Time Calculation (min) (ACUTE ONLY): 19 min   Charges:   PT Evaluation $Initial PT Evaluation Tier I: 1 Procedure     PT G Codes:        Kelsi Benham A Marquies Wanat 09/07/2015, 9:01 AM Mylo Red, PT, DPT [phone]

## 2015-09-07 NOTE — Progress Notes (Cosign Needed)
Patient ID: Anthony AmassJohn L Andersen, male   DOB: 09/30/1940, 74 y.o.   MRN: [id number]    Subjective: 1 Day Post-Op Procedure(s) (LRB): Thorasic nine-eleven DECOMPRESSION (N/A) Patient reports pain as 2 on 0-10 scale.   Denies CP or SOB.  Voiding without difficulty. Positive flatus. Objective: Vital signs in last 24 hours: Temp:  [97.3 F (36.3 C)-98.2 F (36.8 C)] 98.2 F (36.8 C) (12/15 0550) Pulse Rate:  [99-125] 110 (12/15 0550) Resp:  [10-20] 20 (12/15 0550) BP: (116-143)/(75-86) 132/79 mmHg (12/15 0550) SpO2:  [97 %-100 %] 98 % (12/15 0550)  Intake/Output from previous day: 12/14 0701 - 12/15 0700 In: 2880 [P.O.:480; I.V.:2400] Out: 5305 [Urine:5000; Drains:130; Blood:175] Intake/Output this shift:    Labs: No results for input(s): HGB in the last 72 hours. No results for input(s): WBC, RBC, HCT, PLT in the last 72 hours. No results for input(s): NA, K, CL, CO2, BUN, CREATININE, GLUCOSE, CALCIUM in the last 72 hours. No results for input(s): LABPT, INR in the last 72 hours.  Physical Exam: Neurologically intact ABD soft Sensation intact distally Dorsiflexion/Plantar flexion intact Incision: dressing C/D/I Compartment soft  Assessment/Plan: 1 Day Post-Op Procedure(s) (LRB): Thorasic nine-eleven DECOMPRESSION (N/A) Advance diet Up with therapy  Consider DC home after cleared from PT Order Mag Citrate for opioid induced constipation  Mayo, Baxter Kailarmen Christina for Dr. Venita Lickahari Brooks [facility] [phone] 09/07/2015, 7:23 AM

## 2015-10-09 NOTE — Progress Notes (Signed)
   09/07/15 1113  OT G-codes **NOT FOR INPATIENT CLASS**  Functional Assessment Tool Used clinical judgement  Functional Limitation Self care  Self Care Current Status [phone](G8987) CI  Self Care Goal Status (W0981(G8988) CI  Self Care Discharge Status ([id number](G8989) CI  OT General Charges  $OT Visit 1 Procedure  OT Evaluation  $OT Eval Low Complexity 1 Procedure  OT Treatments  $Self Care/Home Management  8-22 mins   Nils PyleJulia Geran Haithcock, OTR/L [phone]

## 2015-10-09 NOTE — Progress Notes (Deleted)
   09/07/15 1001  OT G-codes **NOT FOR INPATIENT CLASS**  Functional Assessment Tool Used clinical judgement  Functional Limitation Self care  Self Care Current Status [phone](G8987) CI  Self Care Goal Status (B1478(G8988) CI  Self Care Discharge Status (G9562(G8989) CI  OT General Charges  $OT Visit 1 Procedure  OT Evaluation  $OT Eval Low Complexity 1 Procedure  OT Treatments  $Self Care/Home Management  8-22 mins   Nils PyleJulia Juquan Reznick, OTR/L Pager: [phone]

## 2015-10-09 NOTE — Progress Notes (Signed)
PT eval addendum- added G-codes    09/07/15 0902  PT G-Codes **NOT FOR INPATIENT CLASS**  Functional Assessment Tool Used clinical judgment  Functional Limitation Mobility: Walking and moving around  Mobility: Walking and Moving Around Current Status (U0454(G8978) CJ  Mobility: Walking and Moving Around Goal Status (U9811(G8979) CI   Mylo RedShauna Kenneth Cuaresma, PT, DPT [phone]

## 2015-12-07 ENCOUNTER — Telehealth: Payer: Self-pay | Admitting: Neurology

## 2015-12-07 NOTE — Telephone Encounter (Signed)
Pt's wife called requesting his MRI results be sent to his pcp. She was told pt will have to come in to sign a release of information form first. She expressed understanding and will have him come by .

## 2020-01-14 ENCOUNTER — Inpatient Hospital Stay (HOSPITAL_COMMUNITY)
Admit: 2020-01-14 | Discharge: 2020-02-07 | Disposition: A | Discharge status: Still In Hospital | Payer: Medicare Other | Source: Other Acute Inpatient Hospital | Attending: Internal Medicine | Admitting: Internal Medicine

## 2020-01-14 ENCOUNTER — Other Ambulatory Visit (HOSPITAL_COMMUNITY): Payer: Medicare Other

## 2020-01-14 DIAGNOSIS — Z4659 Encounter for fitting and adjustment of other gastrointestinal appliance and device: Secondary | ICD-10-CM

## 2020-01-14 DIAGNOSIS — J9621 Acute and chronic respiratory failure with hypoxia: Secondary | ICD-10-CM | POA: Diagnosis present

## 2020-01-14 DIAGNOSIS — J690 Pneumonitis due to inhalation of food and vomit: Secondary | ICD-10-CM | POA: Diagnosis present

## 2020-01-14 DIAGNOSIS — S14103S Unspecified injury at C3 level of cervical spinal cord, sequela: Secondary | ICD-10-CM

## 2020-01-14 DIAGNOSIS — K8020 Calculus of gallbladder without cholecystitis without obstruction: Secondary | ICD-10-CM

## 2020-01-14 DIAGNOSIS — R509 Fever, unspecified: Secondary | ICD-10-CM

## 2020-01-14 DIAGNOSIS — Z431 Encounter for attention to gastrostomy: Secondary | ICD-10-CM

## 2020-01-14 DIAGNOSIS — R0603 Acute respiratory distress: Secondary | ICD-10-CM

## 2020-01-14 DIAGNOSIS — Z930 Tracheostomy status: Secondary | ICD-10-CM

## 2020-01-14 DIAGNOSIS — J398 Other specified diseases of upper respiratory tract: Secondary | ICD-10-CM | POA: Diagnosis present

## 2020-01-14 DIAGNOSIS — J189 Pneumonia, unspecified organism: Secondary | ICD-10-CM

## 2020-01-14 HISTORY — DX: Acute and chronic respiratory failure with hypoxia: J96.21

## 2020-01-14 HISTORY — DX: Tracheostomy status: Z93.0

## 2020-01-14 HISTORY — DX: Other specified diseases of upper respiratory tract: J39.8

## 2020-01-14 HISTORY — DX: Pneumonitis due to inhalation of food and vomit: J69.0

## 2020-01-14 HISTORY — DX: Unspecified injury at c3 level of cervical spinal cord, sequela: S14.103S

## 2020-01-15 ENCOUNTER — Encounter: Payer: Self-pay | Admitting: Internal Medicine

## 2020-01-15 DIAGNOSIS — J690 Pneumonitis due to inhalation of food and vomit: Secondary | ICD-10-CM | POA: Diagnosis not present

## 2020-01-15 DIAGNOSIS — J9621 Acute and chronic respiratory failure with hypoxia: Secondary | ICD-10-CM | POA: Diagnosis present

## 2020-01-15 DIAGNOSIS — S14103S Unspecified injury at C3 level of cervical spinal cord, sequela: Secondary | ICD-10-CM | POA: Diagnosis not present

## 2020-01-15 DIAGNOSIS — J398 Other specified diseases of upper respiratory tract: Secondary | ICD-10-CM | POA: Diagnosis present

## 2020-01-15 DIAGNOSIS — Z930 Tracheostomy status: Secondary | ICD-10-CM

## 2020-01-15 LAB — CBC
HCT: 30.5 % — ABNORMAL LOW (ref 39.0–52.0)
Hemoglobin: 9.4 g/dL — ABNORMAL LOW (ref 13.0–17.0)
MCH: 29 pg (ref 26.0–34.0)
MCHC: 30.8 g/dL (ref 30.0–36.0)
MCV: 94.1 fL (ref 80.0–100.0)
Platelets: 470 10*3/uL — ABNORMAL HIGH (ref 150–400)
RBC: 3.24 MIL/uL — ABNORMAL LOW (ref 4.22–5.81)
RDW: 12 % (ref 11.5–15.5)
WBC: 8.1 10*3/uL (ref 4.0–10.5)
nRBC: 0 % (ref 0.0–0.2)

## 2020-01-15 LAB — BLOOD GAS, ARTERIAL
Acid-Base Excess: 8 mmol/L — ABNORMAL HIGH (ref 0.0–2.0)
Bicarbonate: 32.3 mmol/L — ABNORMAL HIGH (ref 20.0–28.0)
FIO2: 30
O2 Saturation: 96.6 %
Patient temperature: 37
pCO2 arterial: 48 mmHg (ref 32.0–48.0)
pH, Arterial: 7.443 (ref 7.350–7.450)
pO2, Arterial: 81.1 mmHg — ABNORMAL LOW (ref 83.0–108.0)

## 2020-01-15 LAB — COMPREHENSIVE METABOLIC PANEL
ALT: 68 U/L — ABNORMAL HIGH (ref 0–44)
AST: 45 U/L — ABNORMAL HIGH (ref 15–41)
Albumin: 2.1 g/dL — ABNORMAL LOW (ref 3.5–5.0)
Alkaline Phosphatase: 80 U/L (ref 38–126)
Anion gap: 12 (ref 5–15)
BUN: 45 mg/dL — ABNORMAL HIGH (ref 8–23)
CO2: 31 mmol/L (ref 22–32)
Calcium: 9.4 mg/dL (ref 8.9–10.3)
Chloride: 101 mmol/L (ref 98–111)
Creatinine, Ser: 0.72 mg/dL (ref 0.61–1.24)
GFR calc Af Amer: >60 mL/min (ref >60)
GFR calc non Af Amer: >60 mL/min (ref >60)
Glucose, Bld: 183 mg/dL — ABNORMAL HIGH (ref 70–99)
Potassium: 4 mmol/L (ref 3.5–5.1)
Sodium: 144 mmol/L (ref 135–145)
Total Bilirubin: 1 mg/dL (ref 0.3–1.2)
Total Protein: 7.3 g/dL (ref 6.5–8.1)

## 2020-01-15 MED ORDER — INSULIN GLARGINE 100 UNIT/ML ~~LOC~~ SOLN
20.00 | SUBCUTANEOUS | Status: DC
Start: 2020-01-14 — End: 2020-01-15

## 2020-01-15 MED ORDER — ALBUTEROL SULFATE (2.5 MG/3ML) 0.083% IN NEBU
2.50 | INHALATION_SOLUTION | RESPIRATORY_TRACT | Status: DC
Start: ? — End: 2020-01-15

## 2020-01-15 MED ORDER — ONDANSETRON HCL 4 MG/2ML IJ SOLN
4.00 | INTRAMUSCULAR | Status: DC
Start: ? — End: 2020-01-15

## 2020-01-15 MED ORDER — EZETIMIBE 10 MG PO TABS
10.00 | ORAL_TABLET | ORAL | Status: DC
Start: 2020-01-15 — End: 2020-01-15

## 2020-01-15 MED ORDER — NALOXONE HCL 0.4 MG/ML IJ SOLN
0.40 | INTRAMUSCULAR | Status: DC
Start: ? — End: 2020-01-15

## 2020-01-15 MED ORDER — GENERIC EXTERNAL MEDICATION
12.50 | Status: DC
Start: 2020-01-15 — End: 2020-01-15

## 2020-01-15 MED ORDER — METHOCARBAMOL 500 MG PO TABS
500.00 | ORAL_TABLET | ORAL | Status: DC
Start: 2020-01-14 — End: 2020-01-15

## 2020-01-15 MED ORDER — GABAPENTIN 300 MG PO CAPS
600.00 | ORAL_CAPSULE | ORAL | Status: DC
Start: 2020-01-14 — End: 2020-01-15

## 2020-01-15 MED ORDER — CHLORHEXIDINE GLUCONATE 0.12 % MT SOLN
15.00 | OROMUCOSAL | Status: DC
Start: ? — End: 2020-01-15

## 2020-01-15 MED ORDER — MELATONIN 3 MG PO TABS
3.00 | ORAL_TABLET | ORAL | Status: DC
Start: 2020-01-14 — End: 2020-01-15

## 2020-01-15 MED ORDER — PRO-STAT PO LIQD
ORAL | Status: DC
Start: 2020-01-14 — End: 2020-01-15

## 2020-01-15 MED ORDER — ACETAMINOPHEN 500 MG PO TABS
1000.00 | ORAL_TABLET | ORAL | Status: DC
Start: 2020-01-15 — End: 2020-01-15

## 2020-01-15 MED ORDER — DEXTROSE 10 % IV SOLN
125.00 | INTRAVENOUS | Status: DC
Start: ? — End: 2020-01-15

## 2020-01-15 MED ORDER — BACITRACIN ZINC 500 UNIT/GM EX OINT
TOPICAL_OINTMENT | CUTANEOUS | Status: DC
Start: 2020-01-14 — End: 2020-01-15

## 2020-01-15 MED ORDER — INSULIN LISPRO 100 UNIT/ML ~~LOC~~ SOLN
2.00 | SUBCUTANEOUS | Status: DC
Start: 2020-01-15 — End: 2020-01-15

## 2020-01-15 MED ORDER — GENERIC EXTERNAL MEDICATION
Status: DC
Start: ? — End: 2020-01-15

## 2020-01-15 MED ORDER — IPRATROPIUM-ALBUTEROL 0.5-2.5 (3) MG/3ML IN SOLN
3.00 | RESPIRATORY_TRACT | Status: DC
Start: ? — End: 2020-01-15

## 2020-01-15 MED ORDER — HYDROXYZINE HCL 25 MG PO TABS
25.00 | ORAL_TABLET | ORAL | Status: DC
Start: ? — End: 2020-01-15

## 2020-01-15 MED ORDER — OXYCODONE HCL 5 MG PO TABS
10.00 | ORAL_TABLET | ORAL | Status: DC
Start: ? — End: 2020-01-15

## 2020-01-15 MED ORDER — ASPIRIN 81 MG PO CHEW
81.00 | CHEWABLE_TABLET | ORAL | Status: DC
Start: 2020-01-15 — End: 2020-01-15

## 2020-01-15 MED ORDER — SENNOSIDES-DOCUSATE SODIUM 8.6-50 MG PO TABS
2.00 | ORAL_TABLET | ORAL | Status: DC
Start: 2020-01-14 — End: 2020-01-15

## 2020-01-15 MED ORDER — GLUCOSE 40 % PO GEL
15.00 | ORAL | Status: DC
Start: ? — End: 2020-01-15

## 2020-01-15 MED ORDER — CHLORHEXIDINE GLUCONATE 0.12 % MT SOLN
15.00 | OROMUCOSAL | Status: DC
Start: 2020-01-14 — End: 2020-01-15

## 2020-01-15 MED ORDER — POLYETHYLENE GLYCOL 3350 17 GM/SCOOP PO POWD
17.00 | ORAL | Status: DC
Start: 2020-01-15 — End: 2020-01-15

## 2020-01-15 MED ORDER — HYDRALAZINE HCL 20 MG/ML IJ SOLN
10.00 | INTRAMUSCULAR | Status: DC
Start: ? — End: 2020-01-15

## 2020-01-15 MED ORDER — ENOXAPARIN SODIUM 30 MG/0.3ML ~~LOC~~ SOLN
30.00 | SUBCUTANEOUS | Status: DC
Start: 2020-01-14 — End: 2020-01-15

## 2020-01-15 MED ORDER — FAMOTIDINE 20 MG PO TABS
20.00 | ORAL_TABLET | ORAL | Status: DC
Start: 2020-01-14 — End: 2020-01-15

## 2020-01-15 NOTE — Consult Note (Addendum)
Pulmonary Critical Care Medicine [facility]  PULMONARY SERVICE  Date of Service: 01/15/2020  PULMONARY CRITICAL CARE CONSULT   Anthony Andersen  QIH:[id number]  DOB: 06/10/1941   DOA: 01/14/2020  Referring Physician: Carron Curie, MD  HPI: Anthony Andersen is a 79 y.o. male seen for follow up of Acute on Chronic Respiratory Failure.  History and physical is limited by what is present in the chart and care everywhere.  Basically it appears the patient presented to the hospital after a fall.  Patient was found to have cervical involvement and subsequently had difficulty coming off the ventilator.  His hospital course was complicated by development of mucous plugging requiring bronchoscopy.  Apparently bronchoscopy was performed for mucous plugging.  From my perspective of his orthopedic injuries he received a C3-4 fracture and underwent laminectomy and fusion of his C-spine.  Patient did also develop aspiration pneumonia and ended up with a tracheostomy as he was not able to wean off the ventilator.  Review of Systems:  ROS performed and is unremarkable other than noted above.  Past Medical History:  Diagnosis Date  . Anxiety    "at times"  . Arthritis   . Headache   . High cholesterol   . History of bronchitis as a child   . Hypertension   . Nocturia   . Urinary frequency   . Wears glasses     Past Surgical History:  Procedure Laterality Date  . BACK SURGERY     x2  . COLONOSCOPY    . ESOPHAGOGASTRODUODENOSCOPY    . EYE SURGERY Bilateral    cataracts  . HEMORRHOID SURGERY    . KNEE ARTHROSCOPY Left   . LUMBAR LAMINECTOMY/DECOMPRESSION MICRODISCECTOMY N/A 09/06/2015   Procedure: Thorasic nine-eleven DECOMPRESSION;  Surgeon: Venita Lick, MD;  Location: [facility] OR;  Service: Orthopedics;  Laterality: N/A;    Social History:    reports that he has never smoked. He does not have any smokeless tobacco history on file. He reports current alcohol use. He reports that he  does not use drugs.  Family History: Non-Contributory to the present illness  Allergies Reviewed on the [facility]  Medications: Reviewed on Rounds  Physical Exam:  Vitals: Temperature 98.5 pulse 110 respiratory 15 blood pressure is 116/68 saturations 100%  Ventilator Settings on T collar with an FiO2 28%  . General: Comfortable at this time . Eyes: Grossly normal lids, irises & conjunctiva . ENT: grossly tongue is normal . Neck: no obvious mass . Cardiovascular: S1-S2 normal no gallop or rub . Respiratory: No rhonchi no rales are noted at this time . Abdomen: Soft and nontender . Skin: no rash seen on limited exam . Musculoskeletal: not rigid . Psychiatric:unable to assess . Neurologic: no seizure no involuntary movements         Labs on Admission:  Basic Metabolic Panel: Recent Labs  Lab 01/15/20 0523  NA 144  K 4.0  CL 101  CO2 31  GLUCOSE 183*  BUN 45*  CREATININE 0.72  CALCIUM 9.4    Recent Labs  Lab 01/15/20 0209  PHART 7.443  PCO2ART 48.0  PO2ART 81.1*  HCO3 32.3*  O2SAT 96.6    Liver Function Tests: Recent Labs  Lab 01/15/20 0523  AST 45*  ALT 68*  ALKPHOS 80  BILITOT 1.0  PROT 7.3  ALBUMIN 2.1*   No results for input(s): LIPASE, AMYLASE in the last 168 hours. No results for input(s): AMMONIA in the last 168 hours.  CBC: Recent  Labs  Lab 01/15/20 0523  WBC 8.1  HGB 9.4*  HCT 30.5*  MCV 94.1  PLT 470*    Cardiac Enzymes: No results for input(s): CKTOTAL, CKMB, CKMBINDEX, TROPONINI in the last 168 hours.  BNP (last 3 results) No results for input(s): BNP in the last 8760 hours.  ProBNP (last 3 results) No results for input(s): PROBNP in the last 8760 hours.   Radiological Exams on Admission: DG CHEST PORT 1 VIEW  Result Date: 01/14/2020 CLINICAL DATA:  79 year old male status post NG placement. EXAM: PORTABLE CHEST 1 VIEW COMPARISON:  Chest radiograph dated 01/01/2020. FINDINGS: Tracheostomy above the carina. An enteric tube  is noted extending below the diaphragm and to the right of the spine with tip beyond the inferior margin of the image. There is elevation of the right hemidiaphragm. Bibasilar atelectasis. No consolidative changes. There is no pleural effusion pneumothorax. There is mild cardiomegaly. No acute osseous pathology. A rounded calcific focus in the left upper quadrant, possibly vascular calcification. IMPRESSION: 1. Tracheostomy above the carina. 2. Enteric tube with tip beyond the inferior margin of the image. Electronically Signed   By: Anner Crete M.D.   On: 01/14/2020 23:14   DG Abd Portable 1V  Result Date: 01/15/2020 CLINICAL DATA:  Feeding tube placement. EXAM: PORTABLE ABDOMEN - 1 VIEW COMPARISON:  None. FINDINGS: A feeding tube is seen with its distal tip overlying the expected region of the proximal duodenum. This is unchanged in position when compared to the prior study. The bowel gas pattern is normal. A 4 mm renal calculus is seen overlying the lower pole of the left kidney. IMPRESSION: Feeding tube positioning, as described above. Electronically Signed   By: Virgina Norfolk M.D.   On: 01/15/2020 00:08    Assessment/Plan Active Problems:   Acute on chronic respiratory failure with hypoxia (HCC)   C3 spinal cord injury, sequela (HCC)   Aspiration pneumonia due to gastric secretions (HCC)   Increased tracheal secretions   Tracheostomy status ([place])   1. Acute on chronic respiratory failure hypoxia plan is to continue with T collar patient is tolerating right now 20% FiO2 with good saturations. 2. C3-4 fracture stabilized after surgery we will continue to monitor. 3. Aspiration pneumonia treated at the other facility patient has been on antibiotics follow-up radiologically. 4. Tracheostomy status right now is on T collar plan is to continue with 28% FiO2 and wean 5. Retained tracheal secretions needs ongoing aggressive pulmonary toilet.  I have personally seen and evaluated the  patient, evaluated laboratory and imaging results, formulated the assessment and plan and placed orders. The Patient requires high complexity decision making with multiple systems involvement.  Case was discussed on Rounds with the Respiratory Therapy Director and the Respiratory staff Time Spent 59minutes  Tine Mabee A Raymel Cull, MD [facility] Pulmonary Critical Care Medicine Sleep Medicine

## 2020-01-16 DIAGNOSIS — S14103S Unspecified injury at C3 level of cervical spinal cord, sequela: Secondary | ICD-10-CM | POA: Diagnosis not present

## 2020-01-16 DIAGNOSIS — J398 Other specified diseases of upper respiratory tract: Secondary | ICD-10-CM | POA: Diagnosis not present

## 2020-01-16 DIAGNOSIS — J9621 Acute and chronic respiratory failure with hypoxia: Secondary | ICD-10-CM

## 2020-01-16 DIAGNOSIS — J690 Pneumonitis due to inhalation of food and vomit: Secondary | ICD-10-CM

## 2020-01-16 DIAGNOSIS — Z930 Tracheostomy status: Secondary | ICD-10-CM

## 2020-01-16 LAB — URINALYSIS, ROUTINE W REFLEX MICROSCOPIC
Bacteria, UA: NONE SEEN
Bilirubin Urine: NEGATIVE
Glucose, UA: NEGATIVE mg/dL
Hgb urine dipstick: NEGATIVE
Ketones, ur: NEGATIVE mg/dL
Leukocytes,Ua: NEGATIVE
Nitrite: NEGATIVE
Protein, ur: 30 mg/dL — AB
Specific Gravity, Urine: 1.025 (ref 1.005–1.030)
pH: 5 (ref 5.0–8.0)

## 2020-01-16 NOTE — Progress Notes (Signed)
Pulmonary Critical Care Medicine [place]   PULMONARY CRITICAL CARE SERVICE  PROGRESS NOTE  Date of Service: 01/16/2020  Anthony Andersen  [id number]:[id number]  DOB: [birth year]   DOA: 01/14/2020  Referring Physician: Merton Border, MD  HPI: Anthony Andersen is a 79 y.o. male seen for follow up of Acute on Chronic Respiratory Failure.  At this time patient is on T collar currently on 28% FiO2 has been having issues with copious secretions.  Medications: Reviewed on Rounds  Physical Exam:  Vitals: Temperature was 100.1 pulse 116 respiratory rate 32 blood pressure is 120/70 saturations 97%  Ventilator Settings on T collar FiO2 28%  . General: Comfortable at this time . Eyes: Grossly normal lids, irises & conjunctiva . ENT: grossly tongue is normal . Neck: no obvious mass . Cardiovascular: S1 S2 normal no gallop . Respiratory: Coarse breath sounds noted bilaterally . Abdomen: soft . Skin: no rash seen on limited exam . Musculoskeletal: not rigid . Psychiatric:unable to assess . Neurologic: no seizure no involuntary movements         Lab Data:   Basic Metabolic Panel: Recent Labs  Lab 01/15/20 0523  NA 144  K 4.0  CL 101  CO2 31  GLUCOSE 183*  BUN 45*  CREATININE 0.72  CALCIUM 9.4    ABG: Recent Labs  Lab 01/15/20 0209  PHART 7.443  PCO2ART 48.0  PO2ART 81.1*  HCO3 32.3*  O2SAT 96.6    Liver Function Tests: Recent Labs  Lab 01/15/20 0523  AST 45*  ALT 68*  ALKPHOS 80  BILITOT 1.0  PROT 7.3  ALBUMIN 2.1*   No results for input(s): LIPASE, AMYLASE in the last 168 hours. No results for input(s): AMMONIA in the last 168 hours.  CBC: Recent Labs  Lab 01/15/20 0523  WBC 8.1  HGB 9.4*  HCT 30.5*  MCV 94.1  PLT 470*    Cardiac Enzymes: No results for input(s): CKTOTAL, CKMB, CKMBINDEX, TROPONINI in the last 168 hours.  BNP (last 3 results) No results for input(s): BNP in the last 8760 hours.  ProBNP (last 3 results) No results  for input(s): PROBNP in the last 8760 hours.  Radiological Exams: DG CHEST PORT 1 VIEW  Result Date: 01/14/2020 CLINICAL DATA:  79 year old male status post NG placement. EXAM: PORTABLE CHEST 1 VIEW COMPARISON:  Chest radiograph dated 01/01/2020. FINDINGS: Tracheostomy above the carina. An enteric tube is noted extending below the diaphragm and to the right of the spine with tip beyond the inferior margin of the image. There is elevation of the right hemidiaphragm. Bibasilar atelectasis. No consolidative changes. There is no pleural effusion pneumothorax. There is mild cardiomegaly. No acute osseous pathology. A rounded calcific focus in the left upper quadrant, possibly vascular calcification. IMPRESSION: 1. Tracheostomy above the carina. 2. Enteric tube with tip beyond the inferior margin of the image. Electronically Signed   By: Anner Crete M.D.   On: 01/14/2020 23:14   DG Abd Portable 1V  Result Date: 01/15/2020 CLINICAL DATA:  Feeding tube placement. EXAM: PORTABLE ABDOMEN - 1 VIEW COMPARISON:  None. FINDINGS: A feeding tube is seen with its distal tip overlying the expected region of the proximal duodenum. This is unchanged in position when compared to the prior study. The bowel gas pattern is normal. A 4 mm renal calculus is seen overlying the lower pole of the left kidney. IMPRESSION: Feeding tube positioning, as described above. Electronically Signed   By: Virgina Norfolk M.D.   On:  01/15/2020 00:08    Assessment/Plan Active Problems:   Acute on chronic respiratory failure with hypoxia (HCC)   C3 spinal cord injury, sequela (HCC)   Aspiration pneumonia due to gastric secretions (HCC)   Increased tracheal secretions   Tracheostomy status (HCC)   1. Acute on chronic respiratory failure with hypoxia patient still has a cuffed trach in place we will have respiratory therapy change the trach out to a #6 cuffless trach.  Continue with supportive care and pulmonary toilet. 2. Increased  tracheal secretions requiring frequent suctioning with a cuffless trach perhaps this might improve 3. Aspiration pneumonia treated we will continue to follow 4. C3 spinal cord injury no change supportive care has a neck collar in place 5. Tracheostomy will be changed out to a #6 cuffless trach   I have personally seen and evaluated the patient, evaluated laboratory and imaging results, formulated the assessment and plan and placed orders. The Patient requires high complexity decision making with multiple systems involvement.  Rounds were done with the Respiratory Therapy Director and Staff therapists and discussed with nursing staff also.  Yevonne Pax, MD [facility] Pulmonary Critical Care Medicine Sleep Medicine

## 2020-01-17 ENCOUNTER — Other Ambulatory Visit (HOSPITAL_COMMUNITY): Payer: Medicare Other

## 2020-01-17 DIAGNOSIS — J690 Pneumonitis due to inhalation of food and vomit: Secondary | ICD-10-CM | POA: Diagnosis not present

## 2020-01-17 DIAGNOSIS — S14103S Unspecified injury at C3 level of cervical spinal cord, sequela: Secondary | ICD-10-CM | POA: Diagnosis not present

## 2020-01-17 DIAGNOSIS — J398 Other specified diseases of upper respiratory tract: Secondary | ICD-10-CM | POA: Diagnosis not present

## 2020-01-17 DIAGNOSIS — J9621 Acute and chronic respiratory failure with hypoxia: Secondary | ICD-10-CM | POA: Diagnosis not present

## 2020-01-17 LAB — CBC
HCT: 29.3 % — ABNORMAL LOW (ref 39.0–52.0)
Hemoglobin: 9 g/dL — ABNORMAL LOW (ref 13.0–17.0)
MCH: 28.5 pg (ref 26.0–34.0)
MCHC: 30.7 g/dL (ref 30.0–36.0)
MCV: 92.7 fL (ref 80.0–100.0)
Platelets: 420 10*3/uL — ABNORMAL HIGH (ref 150–400)
RBC: 3.16 MIL/uL — ABNORMAL LOW (ref 4.22–5.81)
RDW: 12.3 % (ref 11.5–15.5)
WBC: 10.4 10*3/uL (ref 4.0–10.5)
nRBC: 0 % (ref 0.0–0.2)

## 2020-01-17 LAB — BASIC METABOLIC PANEL
Anion gap: 11 (ref 5–15)
BUN: 35 mg/dL — ABNORMAL HIGH (ref 8–23)
CO2: 29 mmol/L (ref 22–32)
Calcium: 9.2 mg/dL (ref 8.9–10.3)
Chloride: 101 mmol/L (ref 98–111)
Creatinine, Ser: 0.74 mg/dL (ref 0.61–1.24)
GFR calc Af Amer: >60 mL/min (ref >60)
GFR calc non Af Amer: >60 mL/min (ref >60)
Glucose, Bld: 173 mg/dL — ABNORMAL HIGH (ref 70–99)
Potassium: 3.8 mmol/L (ref 3.5–5.1)
Sodium: 141 mmol/L (ref 135–145)

## 2020-01-17 LAB — URINE CULTURE: Culture: NO GROWTH

## 2020-01-17 LAB — MAGNESIUM: Magnesium: 2.1 mg/dL (ref 1.7–2.4)

## 2020-01-17 NOTE — Progress Notes (Signed)
Pulmonary Critical Care Medicine [facility] [place]   PULMONARY CRITICAL CARE SERVICE  PROGRESS NOTE  Date of Service: 01/17/2020  Anthony Andersen  [id number]:[id number]  DOB: 11-23-1940   DOA: 01/14/2020  Referring Physician: Carron Curie, MD  HPI: Anthony Andersen is a 79 y.o. male seen for follow up of Acute on Chronic Respiratory Failure.  Patient currently is on T collar has been on 28% FiO2 secretions are noted to be copious  Medications: Reviewed on Rounds  Physical Exam:  Vitals: Temperature 100.1 pulse 120 respiratory rate 28 blood pressure is 118/66 saturations 97%  Ventilator Settings on T collar with an FiO2 of 28%  . General: Comfortable at this time . Eyes: Grossly normal lids, irises & conjunctiva . ENT: grossly tongue is normal . Neck: no obvious mass . Cardiovascular: S1 S2 normal no gallop . Respiratory: No rhonchi no rales are noted at this time . Abdomen: soft . Skin: no rash seen on limited exam . Musculoskeletal: not rigid . Psychiatric:unable to assess . Neurologic: no seizure no involuntary movements         Lab Data:   Basic Metabolic Panel: Recent Labs  Lab 01/15/20 0523 01/17/20 0738  NA 144 141  K 4.0 3.8  CL 101 101  CO2 31 29  GLUCOSE 183* 173*  BUN 45* 35*  CREATININE 0.72 0.74  CALCIUM 9.4 9.2  MG  --  2.1    ABG: Recent Labs  Lab 01/15/20 0209  PHART 7.443  PCO2ART 48.0  PO2ART 81.1*  HCO3 32.3*  O2SAT 96.6    Liver Function Tests: Recent Labs  Lab 01/15/20 0523  AST 45*  ALT 68*  ALKPHOS 80  BILITOT 1.0  PROT 7.3  ALBUMIN 2.1*   No results for input(s): LIPASE, AMYLASE in the last 168 hours. No results for input(s): AMMONIA in the last 168 hours.  CBC: Recent Labs  Lab 01/15/20 0523 01/17/20 0738  WBC 8.1 10.4  HGB 9.4* 9.0*  HCT 30.5* 29.3*  MCV 94.1 92.7  PLT 470* 420*    Cardiac Enzymes: No results for input(s): CKTOTAL, CKMB, CKMBINDEX, TROPONINI in the last 168 hours.  BNP (last 3  results) No results for input(s): BNP in the last 8760 hours.  ProBNP (last 3 results) No results for input(s): PROBNP in the last 8760 hours.  Radiological Exams: DG CHEST PORT 1 VIEW  Result Date: 01/17/2020 CLINICAL DATA:  Low-grade fever EXAM: PORTABLE CHEST 1 VIEW COMPARISON:  Three days ago FINDINGS: Feeding tube which at least reaches the first portion duodenum. Tracheostomy tube in place Elevated right diaphragm with lower and middle lobe opacification collapse by chest CT 12/24/2019. The left lung is clear. Normal heart size. No visible effusion or pneumothorax. IMPRESSION: Unchanged hardware positioning and right base opacification with elevated diaphragm. Electronically Signed   By: Marnee Spring M.D.   On: 01/17/2020 07:05    Assessment/Plan Active Problems:   Acute on chronic respiratory failure with hypoxia (HCC)   C3 spinal cord injury, sequela (HCC)   Aspiration pneumonia due to gastric secretions (HCC)   Increased tracheal secretions   Tracheostomy status (HCC)   1. Acute on chronic respiratory failure hypoxia we will continue with T collar trials patient still get issues with secretions needs ongoing aggressive pulmonary toilet. 2. C3 spinal cord injury no change supportive care 3. Aspiration pneumonia treated slowly improving we will continue to monitor. 4. Retained secretions aggressive pulmonary toilet 5. Tracheostomy will remain in place   I  have personally seen and evaluated the patient, evaluated laboratory and imaging results, formulated the assessment and plan and placed orders. The Patient requires high complexity decision making with multiple systems involvement.  Rounds were done with the Respiratory Therapy Director and Staff therapists and discussed with nursing staff also.  Allyne Gee, MD [facility] Pulmonary Critical Care Medicine Sleep Medicine

## 2020-01-18 DIAGNOSIS — J9621 Acute and chronic respiratory failure with hypoxia: Secondary | ICD-10-CM | POA: Diagnosis not present

## 2020-01-18 DIAGNOSIS — S14103S Unspecified injury at C3 level of cervical spinal cord, sequela: Secondary | ICD-10-CM | POA: Diagnosis not present

## 2020-01-18 DIAGNOSIS — J690 Pneumonitis due to inhalation of food and vomit: Secondary | ICD-10-CM | POA: Diagnosis not present

## 2020-01-18 DIAGNOSIS — J398 Other specified diseases of upper respiratory tract: Secondary | ICD-10-CM | POA: Diagnosis not present

## 2020-01-18 LAB — CULTURE, RESPIRATORY W GRAM STAIN: Culture: NORMAL

## 2020-01-18 NOTE — Progress Notes (Signed)
Pulmonary Critical Care Medicine [facility] [place]   PULMONARY CRITICAL CARE SERVICE  PROGRESS NOTE  Date of Service: 01/18/2020  Anthony Andersen  EQA:[id number]  DOB: Aug 03, 1941   DOA: 01/14/2020  Referring Physician: Carron Curie, MD  HPI: Anthony Andersen is a 79 y.o. male seen for follow up of Acute on Chronic Respiratory Failure.  Right now patient is on T collar has been on 20% FiO2 with good saturations.  Medications: Reviewed on Rounds  Physical Exam:  Vitals: Temperature is 97.9 pulse 120 respiratory 28 blood pressure 10/[birth year]/1957 saturations 96%  Ventilator Settings on T collar with an FiO2 28%  . General: Comfortable at this time . Eyes: Grossly normal lids, irises & conjunctiva . ENT: grossly tongue is normal . Neck: no obvious mass . Cardiovascular: S1 S2 normal no gallop . Respiratory: No rhonchi coarse breath sounds . Abdomen: soft . Skin: no rash seen on limited exam . Musculoskeletal: not rigid . Psychiatric:unable to assess . Neurologic: no seizure no involuntary movements         Lab Data:   Basic Metabolic Panel: Recent Labs  Lab 01/15/20 0523 01/17/20 0738  NA 144 141  K 4.0 3.8  CL 101 101  CO2 31 29  GLUCOSE 183* 173*  BUN 45* 35*  CREATININE 0.72 0.74  CALCIUM 9.4 9.2  MG  --  2.1    ABG: Recent Labs  Lab 01/15/20 0209  PHART 7.443  PCO2ART 48.0  PO2ART 81.1*  HCO3 32.3*  O2SAT 96.6    Liver Function Tests: Recent Labs  Lab 01/15/20 0523  AST 45*  ALT 68*  ALKPHOS 80  BILITOT 1.0  PROT 7.3  ALBUMIN 2.1*   No results for input(s): LIPASE, AMYLASE in the last 168 hours. No results for input(s): AMMONIA in the last 168 hours.  CBC: Recent Labs  Lab 01/15/20 0523 01/17/20 0738  WBC 8.1 10.4  HGB 9.4* 9.0*  HCT 30.5* 29.3*  MCV 94.1 92.7  PLT 470* 420*    Cardiac Enzymes: No results for input(s): CKTOTAL, CKMB, CKMBINDEX, TROPONINI in the last 168 hours.  BNP (last 3 results) No results for input(s):  BNP in the last 8760 hours.  ProBNP (last 3 results) No results for input(s): PROBNP in the last 8760 hours.  Radiological Exams: DG CHEST PORT 1 VIEW  Result Date: 01/17/2020 CLINICAL DATA:  Low-grade fever EXAM: PORTABLE CHEST 1 VIEW COMPARISON:  Three days ago FINDINGS: Feeding tube which at least reaches the first portion duodenum. Tracheostomy tube in place Elevated right diaphragm with lower and middle lobe opacification collapse by chest CT 12/24/2019. The left lung is clear. Normal heart size. No visible effusion or pneumothorax. IMPRESSION: Unchanged hardware positioning and right base opacification with elevated diaphragm. Electronically Signed   By: Marnee Spring M.D.   On: 01/17/2020 07:05    Assessment/Plan Active Problems:   Acute on chronic respiratory failure with hypoxia (HCC)   C3 spinal cord injury, sequela (HCC)   Aspiration pneumonia due to gastric secretions (HCC)   Increased tracheal secretions   Tracheostomy status (HCC)   1. Acute on chronic respiratory failure hypoxia plan is to continue with T collar trials titrate oxygen continue pulmonary toilet. 2. C3 spinal cord injury no change we will continue to follow along 3. Aspiration pneumonia.  We will continue to monitor 4. Retained tracheal secretions at baseline 5. Tracheostomy remains in place   I have personally seen and evaluated the patient, evaluated laboratory and imaging results,  formulated the assessment and plan and placed orders. The Patient requires high complexity decision making with multiple systems involvement.  Rounds were done with the Respiratory Therapy Director and Staff therapists and discussed with nursing staff also.  Allyne Gee, MD [facility] Pulmonary Critical Care Medicine Sleep Medicine

## 2020-01-19 DIAGNOSIS — S14103S Unspecified injury at C3 level of cervical spinal cord, sequela: Secondary | ICD-10-CM | POA: Diagnosis not present

## 2020-01-19 DIAGNOSIS — J398 Other specified diseases of upper respiratory tract: Secondary | ICD-10-CM | POA: Diagnosis not present

## 2020-01-19 DIAGNOSIS — J690 Pneumonitis due to inhalation of food and vomit: Secondary | ICD-10-CM | POA: Diagnosis not present

## 2020-01-19 DIAGNOSIS — J9621 Acute and chronic respiratory failure with hypoxia: Secondary | ICD-10-CM | POA: Diagnosis not present

## 2020-01-19 LAB — BASIC METABOLIC PANEL
Anion gap: 11 (ref 5–15)
BUN: 52 mg/dL — ABNORMAL HIGH (ref 8–23)
CO2: 29 mmol/L (ref 22–32)
Calcium: 9.3 mg/dL (ref 8.9–10.3)
Chloride: 100 mmol/L (ref 98–111)
Creatinine, Ser: 0.79 mg/dL (ref 0.61–1.24)
GFR calc Af Amer: >60 mL/min (ref >60)
GFR calc non Af Amer: >60 mL/min (ref >60)
Glucose, Bld: 179 mg/dL — ABNORMAL HIGH (ref 70–99)
Potassium: 4.9 mmol/L (ref 3.5–5.1)
Sodium: 140 mmol/L (ref 135–145)

## 2020-01-19 LAB — CBC
HCT: 27.1 % — ABNORMAL LOW (ref 39.0–52.0)
Hemoglobin: 8.3 g/dL — ABNORMAL LOW (ref 13.0–17.0)
MCH: 28.3 pg (ref 26.0–34.0)
MCHC: 30.6 g/dL (ref 30.0–36.0)
MCV: 92.5 fL (ref 80.0–100.0)
Platelets: 329 10*3/uL (ref 150–400)
RBC: 2.93 MIL/uL — ABNORMAL LOW (ref 4.22–5.81)
RDW: 12.5 % (ref 11.5–15.5)
WBC: 10.4 10*3/uL (ref 4.0–10.5)
nRBC: 0 % (ref 0.0–0.2)

## 2020-01-19 LAB — URINALYSIS, ROUTINE W REFLEX MICROSCOPIC
Bilirubin Urine: NEGATIVE
Glucose, UA: NEGATIVE mg/dL
Hgb urine dipstick: NEGATIVE
Ketones, ur: NEGATIVE mg/dL
Leukocytes,Ua: NEGATIVE
Nitrite: NEGATIVE
Protein, ur: 30 mg/dL — AB
Specific Gravity, Urine: 1.021 (ref 1.005–1.030)
pH: 5 (ref 5.0–8.0)

## 2020-01-19 LAB — MAGNESIUM: Magnesium: 2 mg/dL (ref 1.7–2.4)

## 2020-01-19 LAB — PHOSPHORUS: Phosphorus: 4 mg/dL (ref 2.5–4.6)

## 2020-01-19 NOTE — Progress Notes (Signed)
Pulmonary Critical Care Medicine [facility] [place]   PULMONARY CRITICAL CARE SERVICE  PROGRESS NOTE  Date of Service: 01/19/2020  Anthony Andersen  [id number]  DOB: September 18, 1941   DOA: 01/14/2020  Referring Physician: Carron Curie, MD  HPI: Anthony Andersen is a 79 y.o. male seen for follow up of Acute on Chronic Respiratory Failure.  Patient is on T collar still with copious secretions.  Chest x-ray was showing an elevated right hemidiaphragm and concern that there might be some atelectasis versus diaphragmatic paralysis.  May warrant an evaluation of the airways  Medications: Reviewed on Rounds  Physical Exam:  Vitals: Temperature 99.4 pulse 128 respiratory rate 30 blood pressure is 124/67 saturations 98%  Ventilator Settings on T collar currently on 35% FiO2  . General: Comfortable at this time . Eyes: Grossly normal lids, irises & conjunctiva . ENT: grossly tongue is normal . Neck: no obvious mass . Cardiovascular: S1 S2 normal no gallop . Respiratory: No rhonchi coarse breath sounds are noted . Abdomen: soft . Skin: no rash seen on limited exam . Musculoskeletal: not rigid . Psychiatric:unable to assess . Neurologic: no seizure no involuntary movements         Lab Data:   Basic Metabolic Panel: Recent Labs  Lab 01/15/20 0523 01/17/20 0738 01/19/20 0650  NA 144 141 140  K 4.0 3.8 4.9  CL 101 101 100  CO2 31 29 29   GLUCOSE 183* 173* 179*  BUN 45* 35* 52*  CREATININE 0.72 0.74 0.79  CALCIUM 9.4 9.2 9.3  MG  --  2.1 2.0  PHOS  --   --  4.0    ABG: Recent Labs  Lab 01/15/20 0209  PHART 7.443  PCO2ART 48.0  PO2ART 81.1*  HCO3 32.3*  O2SAT 96.6    Liver Function Tests: Recent Labs  Lab 01/15/20 0523  AST 45*  ALT 68*  ALKPHOS 80  BILITOT 1.0  PROT 7.3  ALBUMIN 2.1*   No results for input(s): LIPASE, AMYLASE in the last 168 hours. No results for input(s): AMMONIA in the last 168 hours.  CBC: Recent Labs  Lab 01/15/20 0523  01/17/20 0738 01/19/20 0650  WBC 8.1 10.4 10.4  HGB 9.4* 9.0* 8.3*  HCT 30.5* 29.3* 27.1*  MCV 94.1 92.7 92.5  PLT 470* 420* 329    Cardiac Enzymes: No results for input(s): CKTOTAL, CKMB, CKMBINDEX, TROPONINI in the last 168 hours.  BNP (last 3 results) No results for input(s): BNP in the last 8760 hours.  ProBNP (last 3 results) No results for input(s): PROBNP in the last 8760 hours.  Radiological Exams: No results found.  Assessment/Plan Active Problems:   Acute on chronic respiratory failure with hypoxia (HCC)   C3 spinal cord injury, sequela (HCC)   Aspiration pneumonia due to gastric secretions (HCC)   Increased tracheal secretions   Tracheostomy status (HCC)   1. Acute on chronic respiratory failure with hypoxia patient currently is on T collar on 35% FiO2.  Continue with aggressive pulmonary toilet the secretions are limiting 01/21/20 as far as being able to advance the weaning 2. C3 spinal cord injury no change we will continue with supportive care likely has some diaphragmatic involvement based on the elevation of the right hemidiaphragm. 3. Aspiration pneumonia treated we will continue with supportive care 4. Retained tracheal secretions continue to assess the possibility of doing a bronchoscopy for airway evaluation 5. Tracheostomy remains in place right now   I have personally seen and evaluated the patient,  evaluated laboratory and imaging results, formulated the assessment and plan and placed orders. The Patient requires high complexity decision making with multiple systems involvement.  Rounds were done with the Respiratory Therapy Director and Staff therapists and discussed with nursing staff also.  Allyne Gee, MD [facility] Pulmonary Critical Care Medicine Sleep Medicine

## 2020-01-20 DIAGNOSIS — J9621 Acute and chronic respiratory failure with hypoxia: Secondary | ICD-10-CM | POA: Diagnosis not present

## 2020-01-20 DIAGNOSIS — J398 Other specified diseases of upper respiratory tract: Secondary | ICD-10-CM | POA: Diagnosis not present

## 2020-01-20 DIAGNOSIS — S14103S Unspecified injury at C3 level of cervical spinal cord, sequela: Secondary | ICD-10-CM | POA: Diagnosis not present

## 2020-01-20 DIAGNOSIS — J690 Pneumonitis due to inhalation of food and vomit: Secondary | ICD-10-CM | POA: Diagnosis not present

## 2020-01-20 LAB — URINE CULTURE: Culture: NO GROWTH

## 2020-01-20 NOTE — Progress Notes (Signed)
Pulmonary Critical Care Medicine [facility] [place]   PULMONARY CRITICAL CARE SERVICE  PROGRESS NOTE  Date of Service: 01/20/2020  Anthony Andersen  [id number]:[id number]  DOB: 1941-04-14   DOA: 01/14/2020  Referring Physician: Carron Curie, MD  HPI: Anthony Andersen is a 79 y.o. male seen for follow up of Acute on Chronic Respiratory Failure.  Patient currently is on T collar has been on 40% FiO2  Medications: Reviewed on Rounds  Physical Exam:  Vitals: Temperature is 98.8 pulse 119 respiratory rate 37 blood pressure is 126/66 saturations 96%  Ventilator Settings on T collar with an FiO2 of 40%  . General: Comfortable at this time . Eyes: Grossly normal lids, irises & conjunctiva . ENT: grossly tongue is normal . Neck: no obvious mass . Cardiovascular: S1 S2 normal no gallop . Respiratory: No rhonchi coarse breath sounds . Abdomen: soft . Skin: no rash seen on limited exam . Musculoskeletal: not rigid . Psychiatric:unable to assess . Neurologic: no seizure no involuntary movements         Lab Data:   Basic Metabolic Panel: Recent Labs  Lab 01/15/20 0523 01/17/20 0738 01/19/20 0650  NA 144 141 140  K 4.0 3.8 4.9  CL 101 101 100  CO2 31 29 29   GLUCOSE 183* 173* 179*  BUN 45* 35* 52*  CREATININE 0.72 0.74 0.79  CALCIUM 9.4 9.2 9.3  MG  --  2.1 2.0  PHOS  --   --  4.0    ABG: Recent Labs  Lab 01/15/20 0209  PHART 7.443  PCO2ART 48.0  PO2ART 81.1*  HCO3 32.3*  O2SAT 96.6    Liver Function Tests: Recent Labs  Lab 01/15/20 0523  AST 45*  ALT 68*  ALKPHOS 80  BILITOT 1.0  PROT 7.3  ALBUMIN 2.1*   No results for input(s): LIPASE, AMYLASE in the last 168 hours. No results for input(s): AMMONIA in the last 168 hours.  CBC: Recent Labs  Lab 01/15/20 0523 01/17/20 0738 01/19/20 0650  WBC 8.1 10.4 10.4  HGB 9.4* 9.0* 8.3*  HCT 30.5* 29.3* 27.1*  MCV 94.1 92.7 92.5  PLT 470* 420* 329    Cardiac Enzymes: No results for input(s): CKTOTAL,  CKMB, CKMBINDEX, TROPONINI in the last 168 hours.  BNP (last 3 results) No results for input(s): BNP in the last 8760 hours.  ProBNP (last 3 results) No results for input(s): PROBNP in the last 8760 hours.  Radiological Exams: No results found.  Assessment/Plan Active Problems:   Acute on chronic respiratory failure with hypoxia (HCC)   C3 spinal cord injury, sequela (HCC)   Aspiration pneumonia due to gastric secretions (HCC)   Increased tracheal secretions   Tracheostomy status (HCC)   1. Acute on chronic respiratory failure hypoxia continue T collar trials titrate oxygen continue pulmonary toilet. 2. C3 spinal cord injury no change we will continue to follow 3. Aspiration pneumonia treated we will continue with supportive care 4. Retained tracheal secretions at baseline 5. Tracheostomy remains in place   I have personally seen and evaluated the patient, evaluated laboratory and imaging results, formulated the assessment and plan and placed orders. The Patient requires high complexity decision making with multiple systems involvement.  Rounds were done with the Respiratory Therapy Director and Staff therapists and discussed with nursing staff also.  01/21/20, MD [facility] Pulmonary Critical Care Medicine Sleep Medicine

## 2020-01-21 DIAGNOSIS — J9621 Acute and chronic respiratory failure with hypoxia: Secondary | ICD-10-CM | POA: Diagnosis not present

## 2020-01-21 DIAGNOSIS — J690 Pneumonitis due to inhalation of food and vomit: Secondary | ICD-10-CM | POA: Diagnosis not present

## 2020-01-21 DIAGNOSIS — J398 Other specified diseases of upper respiratory tract: Secondary | ICD-10-CM | POA: Diagnosis not present

## 2020-01-21 DIAGNOSIS — S14103S Unspecified injury at C3 level of cervical spinal cord, sequela: Secondary | ICD-10-CM | POA: Diagnosis not present

## 2020-01-21 NOTE — Progress Notes (Signed)
Pulmonary Critical Care Medicine [facility] [place]   PULMONARY CRITICAL CARE SERVICE  PROGRESS NOTE  Date of Service: 01/21/2020  Anthony Andersen  GUY:[id number]  DOB: 09-02-1941   DOA: 01/14/2020  Referring Physician: Carron Curie, MD  HPI: Anthony Andersen is a 79 y.o. male seen for follow up of Acute on Chronic Respiratory Failure.  Patient currently is on T collar has been on 40% FiO2 is scheduled for PEG evaluation  Medications: Reviewed on Rounds  Physical Exam:  Vitals: Temperature is 98.8 pulse 101 respiratory 17 blood pressure is 106/57 saturations 100%  Ventilator Settings on T collar with an FiO2 of 40%  . General: Comfortable at this time . Eyes: Grossly normal lids, irises & conjunctiva . ENT: grossly tongue is normal . Neck: no obvious mass . Cardiovascular: S1 S2 normal no gallop . Respiratory: No rhonchi coarse breath sounds . Abdomen: soft . Skin: no rash seen on limited exam . Musculoskeletal: not rigid . Psychiatric:unable to assess . Neurologic: no seizure no involuntary movements         Lab Data:   Basic Metabolic Panel: Recent Labs  Lab 01/15/20 0523 01/17/20 0738 01/19/20 0650  NA 144 141 140  K 4.0 3.8 4.9  CL 101 101 100  CO2 31 29 29   GLUCOSE 183* 173* 179*  BUN 45* 35* 52*  CREATININE 0.72 0.74 0.79  CALCIUM 9.4 9.2 9.3  MG  --  2.1 2.0  PHOS  --   --  4.0    ABG: Recent Labs  Lab 01/15/20 0209  PHART 7.443  PCO2ART 48.0  PO2ART 81.1*  HCO3 32.3*  O2SAT 96.6    Liver Function Tests: Recent Labs  Lab 01/15/20 0523  AST 45*  ALT 68*  ALKPHOS 80  BILITOT 1.0  PROT 7.3  ALBUMIN 2.1*   No results for input(s): LIPASE, AMYLASE in the last 168 hours. No results for input(s): AMMONIA in the last 168 hours.  CBC: Recent Labs  Lab 01/15/20 0523 01/17/20 0738 01/19/20 0650  WBC 8.1 10.4 10.4  HGB 9.4* 9.0* 8.3*  HCT 30.5* 29.3* 27.1*  MCV 94.1 92.7 92.5  PLT 470* 420* 329    Cardiac Enzymes: No  results for input(s): CKTOTAL, CKMB, CKMBINDEX, TROPONINI in the last 168 hours.  BNP (last 3 results) No results for input(s): BNP in the last 8760 hours.  ProBNP (last 3 results) No results for input(s): PROBNP in the last 8760 hours.  Radiological Exams: No results found.  Assessment/Plan Active Problems:   Acute on chronic respiratory failure with hypoxia (HCC)   C3 spinal cord injury, sequela (HCC)   Aspiration pneumonia due to gastric secretions (HCC)   Increased tracheal secretions   Tracheostomy status (HCC)   1. Acute on chronic respiratory failure hypoxia continue with on T collar at 40% FiO2 continue secretion management supportive care 2. C3 spinal cord injury no change continue to monitor 3. Aspiration pneumonia treated clinically improving 4. Retained tracheal secretions continue aggressive pulmonary toilet 5. Tracheostomy status remains in place   I have personally seen and evaluated the patient, evaluated laboratory and imaging results, formulated the assessment and plan and placed orders. The Patient requires high complexity decision making with multiple systems involvement.  Rounds were done with the Respiratory Therapy Director and Staff therapists and discussed with nursing staff also.  01/21/20, MD [facility] Pulmonary Critical Care Medicine Sleep Medicine

## 2020-01-22 ENCOUNTER — Other Ambulatory Visit (HOSPITAL_COMMUNITY): Payer: Medicare Other

## 2020-01-22 DIAGNOSIS — J9621 Acute and chronic respiratory failure with hypoxia: Secondary | ICD-10-CM | POA: Diagnosis not present

## 2020-01-22 DIAGNOSIS — Z930 Tracheostomy status: Secondary | ICD-10-CM | POA: Diagnosis not present

## 2020-01-22 DIAGNOSIS — J690 Pneumonitis due to inhalation of food and vomit: Secondary | ICD-10-CM | POA: Diagnosis not present

## 2020-01-22 DIAGNOSIS — J398 Other specified diseases of upper respiratory tract: Secondary | ICD-10-CM | POA: Diagnosis not present

## 2020-01-22 LAB — BASIC METABOLIC PANEL
Anion gap: 11 (ref 5–15)
BUN: 54 mg/dL — ABNORMAL HIGH (ref 8–23)
CO2: 30 mmol/L (ref 22–32)
Calcium: 9.3 mg/dL (ref 8.9–10.3)
Chloride: 99 mmol/L (ref 98–111)
Creatinine, Ser: 0.65 mg/dL (ref 0.61–1.24)
GFR calc Af Amer: >60 mL/min (ref >60)
GFR calc non Af Amer: >60 mL/min (ref >60)
Glucose, Bld: 165 mg/dL — ABNORMAL HIGH (ref 70–99)
Potassium: 4.8 mmol/L (ref 3.5–5.1)
Sodium: 140 mmol/L (ref 135–145)

## 2020-01-22 LAB — CBC
HCT: 28.2 % — ABNORMAL LOW (ref 39.0–52.0)
Hemoglobin: 8.6 g/dL — ABNORMAL LOW (ref 13.0–17.0)
MCH: 28 pg (ref 26.0–34.0)
MCHC: 30.5 g/dL (ref 30.0–36.0)
MCV: 91.9 fL (ref 80.0–100.0)
Platelets: 357 10*3/uL (ref 150–400)
RBC: 3.07 MIL/uL — ABNORMAL LOW (ref 4.22–5.81)
RDW: 12.3 % (ref 11.5–15.5)
WBC: 10.1 10*3/uL (ref 4.0–10.5)
nRBC: 0 % (ref 0.0–0.2)

## 2020-01-22 LAB — PHOSPHORUS: Phosphorus: 3.9 mg/dL (ref 2.5–4.6)

## 2020-01-22 LAB — CULTURE, RESPIRATORY W GRAM STAIN

## 2020-01-22 LAB — MAGNESIUM: Magnesium: 2.2 mg/dL (ref 1.7–2.4)

## 2020-01-22 NOTE — Progress Notes (Signed)
Pulmonary Critical Care Medicine [place]   PULMONARY CRITICAL CARE SERVICE  PROGRESS NOTE  Date of Service: 01/22/2020  Anthony Andersen  NID:[id number]  DOB: Dec 28, 1940   DOA: 01/14/2020  Referring Physician: Merton Border, MD  HPI: Anthony Andersen is a 79 y.o. male seen for follow up of Acute on Chronic Respiratory Failure.  Patient currently is on T collar secretions are fair to moderate requiring suctioning frequently  Medications: Reviewed on Rounds  Physical Exam:  Vitals: Temperature is 98.1 pulse 118 respiratory 22 blood pressure 116/62 saturations 98%  Ventilator Settings on T collar with an FiO2 28%  . General: Comfortable at this time . Eyes: Grossly normal lids, irises & conjunctiva . ENT: grossly tongue is normal . Neck: no obvious mass . Cardiovascular: S1 S2 normal no gallop . Respiratory: No rhonchi coarse breath sounds are noted . Abdomen: soft . Skin: no rash seen on limited exam . Musculoskeletal: not rigid . Psychiatric:unable to assess . Neurologic: no seizure no involuntary movements         Lab Data:   Basic Metabolic Panel: Recent Labs  Lab 01/17/20 0738 01/19/20 0650 01/22/20 0418  NA 141 140 140  K 3.8 4.9 4.8  CL 101 100 99  CO2 29 29 30   GLUCOSE 173* 179* 165*  BUN 35* 52* 54*  CREATININE 0.74 0.79 0.65  CALCIUM 9.2 9.3 9.3  MG 2.1 2.0 2.2  PHOS  --  4.0 3.9    ABG: No results for input(s): PHART, PCO2ART, PO2ART, HCO3, O2SAT in the last 168 hours.  Liver Function Tests: No results for input(s): AST, ALT, ALKPHOS, BILITOT, PROT, ALBUMIN in the last 168 hours. No results for input(s): LIPASE, AMYLASE in the last 168 hours. No results for input(s): AMMONIA in the last 168 hours.  CBC: Recent Labs  Lab 01/17/20 0738 01/19/20 0650 01/22/20 0418  WBC 10.4 10.4 10.1  HGB 9.0* 8.3* 8.6*  HCT 29.3* 27.1* 28.2*  MCV 92.7 92.5 91.9  PLT 420* 329 357    Cardiac Enzymes: No results for input(s): CKTOTAL, CKMB,  CKMBINDEX, TROPONINI in the last 168 hours.  BNP (last 3 results) No results for input(s): BNP in the last 8760 hours.  ProBNP (last 3 results) No results for input(s): PROBNP in the last 8760 hours.  Radiological Exams: CT ABDOMEN WO CONTRAST  Result Date: 01/22/2020 CLINICAL DATA:  Respiratory failure, spinal cord injury and evaluation for possible percutaneous gastrostomy tube placement for nutrition. EXAM: CT ABDOMEN WITHOUT CONTRAST TECHNIQUE: Multidetector CT imaging of the abdomen was performed following the standard protocol without IV contrast. COMPARISON:  None. FINDINGS: Lower chest: Posterior right lower lobe atelectasis/consolidation and mild atelectasis at the left lung base. Hepatobiliary: No focal liver abnormality is seen. Calcified gallstone is present in the region of the neck of the gallbladder. No biliary dilatation. Pancreas: Unremarkable. No pancreatic ductal dilatation or surrounding inflammatory changes. Spleen: Normal in size without focal abnormality. Adrenals/Urinary Tract: Unenhanced appearance of the adrenal glands is unremarkable. There is a small nonobstructing calculus in the lower pole of the left kidney measuring up to 6 mm. Stomach/Bowel: No hiatal hernia. Feeding tube extends through the stomach and duodenum with the tip located in the proximal jejunum. The stomach is horizontal and fairly high in position, predominantly substernal and subxiphoid when decompressed. The stomach does lie superior to the transverse colon. There is no evidence of visible bowel obstruction, ileus, inflammatory process or free air. Vascular/Lymphatic: No significant vascular findings are present.  No enlarged abdominal lymph nodes. Other: No hernias, ascites, focal fluid collections or body wall edema. Musculoskeletal: No acute or significant osseous findings. IMPRESSION: 1. The stomach is horizontal and fairly high in position but does lie superior to the transverse colon. With air  insufflation, a window for placement of a percutaneous gastrostomy tube should be present. 2. Posterior right lower lobe atelectasis/consolidation and mild atelectasis at the left lung base. 3. Cholelithiasis. 4. Nonobstructing 6 mm calculus in the lower pole of the left kidney. Electronically Signed   By: Irish Lack M.D.   On: 01/22/2020 15:20   DG CHEST PORT 1 VIEW  Result Date: 01/22/2020 CLINICAL DATA:  Pneumonia. EXAM: PORTABLE CHEST 1 VIEW COMPARISON:  01/17/2020 FINDINGS: Tracheostomy tube in adequate position. Enteric tube courses to the stomach and proximal duodenum and off the film as tip is not visualized. Stable moderate elevation of the right hemidiaphragm. Minimal hazy prominence of the right perihilar vessels unchanged and may be due to minimal vascular congestion. Remainder the lungs are clear. Cardiomediastinal silhouette and remainder of the exam is unchanged. IMPRESSION: Stable elevation of the right hemidiaphragm with mild stable prominence of the right perihilar vessels suggesting minimal vascular congestion. Electronically Signed   By: Elberta Fortis M.D.   On: 01/22/2020 08:40    Assessment/Plan Active Problems:   Acute on chronic respiratory failure with hypoxia (HCC)   C3 spinal cord injury, sequela (HCC)   Aspiration pneumonia due to gastric secretions (HCC)   Increased tracheal secretions   Tracheostomy status (HCC)   1. Acute on chronic respiratory failure hypoxia we will continue with T collar trials patient remains on 28% FiO2 with good saturations. 2. C3 spinal cord injury no change supportive care 3. Aspiration pneumonia treated clinically improved 4. Retained secretions continue aggressive pulmonary toilet. 5. Tracheostomy will remain in place at this point   I have personally seen and evaluated the patient, evaluated laboratory and imaging results, formulated the assessment and plan and placed orders. The Patient requires high complexity decision making  with multiple systems involvement.  Rounds were done with the Respiratory Therapy Director and Staff therapists and discussed with nursing staff also.  Yevonne Pax, MD [facility] Pulmonary Critical Care Medicine Sleep Medicine

## 2020-01-23 DIAGNOSIS — Z930 Tracheostomy status: Secondary | ICD-10-CM | POA: Diagnosis not present

## 2020-01-23 DIAGNOSIS — J398 Other specified diseases of upper respiratory tract: Secondary | ICD-10-CM | POA: Diagnosis not present

## 2020-01-23 DIAGNOSIS — J9621 Acute and chronic respiratory failure with hypoxia: Secondary | ICD-10-CM | POA: Diagnosis not present

## 2020-01-23 DIAGNOSIS — J690 Pneumonitis due to inhalation of food and vomit: Secondary | ICD-10-CM | POA: Diagnosis not present

## 2020-01-23 NOTE — Progress Notes (Signed)
Pulmonary Critical Care Medicine [place]   PULMONARY CRITICAL CARE SERVICE  PROGRESS NOTE  Date of Service: 01/23/2020  Anthony Andersen  PJK:[id number]  DOB: [birth year]   DOA: 01/14/2020  Referring Physician: Merton Border, MD  HPI: Anthony Andersen is a 79 y.o. male seen for follow up of Acute on Chronic Respiratory Failure.  Patient remains at baseline on T collar with 28% FiO2 good saturations are noted  Medications: Reviewed on Rounds  Physical Exam:  Vitals: Temperature 97.9 pulse 111 respiratory 14 blood pressure is 1.49 saturations 97%  Ventilator Settings on T collar FiO2 20%  . General: Comfortable at this time . Eyes: Grossly normal lids, irises & conjunctiva . ENT: grossly tongue is normal . Neck: no obvious mass . Cardiovascular: S1 S2 normal no gallop . Respiratory: No rhonchi no rales are noted at this time . Abdomen: soft . Skin: no rash seen on limited exam . Musculoskeletal: not rigid . Psychiatric:unable to assess . Neurologic: no seizure no involuntary movements         Lab Data:   Basic Metabolic Panel: Recent Labs  Lab 01/17/20 0738 01/19/20 0650 01/22/20 0418  NA 141 140 140  K 3.8 4.9 4.8  CL 101 100 99  CO2 29 29 30   GLUCOSE 173* 179* 165*  BUN 35* 52* 54*  CREATININE 0.74 0.79 0.65  CALCIUM 9.2 9.3 9.3  MG 2.1 2.0 2.2  PHOS  --  4.0 3.9    ABG: No results for input(s): PHART, PCO2ART, PO2ART, HCO3, O2SAT in the last 168 hours.  Liver Function Tests: No results for input(s): AST, ALT, ALKPHOS, BILITOT, PROT, ALBUMIN in the last 168 hours. No results for input(s): LIPASE, AMYLASE in the last 168 hours. No results for input(s): AMMONIA in the last 168 hours.  CBC: Recent Labs  Lab 01/17/20 0738 01/19/20 0650 01/22/20 0418  WBC 10.4 10.4 10.1  HGB 9.0* 8.3* 8.6*  HCT 29.3* 27.1* 28.2*  MCV 92.7 92.5 91.9  PLT 420* 329 357    Cardiac Enzymes: No results for input(s): CKTOTAL, CKMB, CKMBINDEX, TROPONINI in  the last 168 hours.  BNP (last 3 results) No results for input(s): BNP in the last 8760 hours.  ProBNP (last 3 results) No results for input(s): PROBNP in the last 8760 hours.  Radiological Exams: CT ABDOMEN WO CONTRAST  Result Date: 01/22/2020 CLINICAL DATA:  Respiratory failure, spinal cord injury and evaluation for possible percutaneous gastrostomy tube placement for nutrition. EXAM: CT ABDOMEN WITHOUT CONTRAST TECHNIQUE: Multidetector CT imaging of the abdomen was performed following the standard protocol without IV contrast. COMPARISON:  None. FINDINGS: Lower chest: Posterior right lower lobe atelectasis/consolidation and mild atelectasis at the left lung base. Hepatobiliary: No focal liver abnormality is seen. Calcified gallstone is present in the region of the neck of the gallbladder. No biliary dilatation. Pancreas: Unremarkable. No pancreatic ductal dilatation or surrounding inflammatory changes. Spleen: Normal in size without focal abnormality. Adrenals/Urinary Tract: Unenhanced appearance of the adrenal glands is unremarkable. There is a small nonobstructing calculus in the lower pole of the left kidney measuring up to 6 mm. Stomach/Bowel: No hiatal hernia. Feeding tube extends through the stomach and duodenum with the tip located in the proximal jejunum. The stomach is horizontal and fairly high in position, predominantly substernal and subxiphoid when decompressed. The stomach does lie superior to the transverse colon. There is no evidence of visible bowel obstruction, ileus, inflammatory process or free air. Vascular/Lymphatic: No significant vascular findings are present.  No enlarged abdominal lymph nodes. Other: No hernias, ascites, focal fluid collections or body wall edema. Musculoskeletal: No acute or significant osseous findings. IMPRESSION: 1. The stomach is horizontal and fairly high in position but does lie superior to the transverse colon. With air insufflation, a window for  placement of a percutaneous gastrostomy tube should be present. 2. Posterior right lower lobe atelectasis/consolidation and mild atelectasis at the left lung base. 3. Cholelithiasis. 4. Nonobstructing 6 mm calculus in the lower pole of the left kidney. Electronically Signed   By: Irish Lack M.D.   On: 01/22/2020 15:20   DG CHEST PORT 1 VIEW  Result Date: 01/22/2020 CLINICAL DATA:  Pneumonia. EXAM: PORTABLE CHEST 1 VIEW COMPARISON:  01/17/2020 FINDINGS: Tracheostomy tube in adequate position. Enteric tube courses to the stomach and proximal duodenum and off the film as tip is not visualized. Stable moderate elevation of the right hemidiaphragm. Minimal hazy prominence of the right perihilar vessels unchanged and may be due to minimal vascular congestion. Remainder the lungs are clear. Cardiomediastinal silhouette and remainder of the exam is unchanged. IMPRESSION: Stable elevation of the right hemidiaphragm with mild stable prominence of the right perihilar vessels suggesting minimal vascular congestion. Electronically Signed   By: Elberta Fortis M.D.   On: 01/22/2020 08:40    Assessment/Plan Active Problems:   Acute on chronic respiratory failure with hypoxia (HCC)   C3 spinal cord injury, sequela (HCC)   Aspiration pneumonia due to gastric secretions (HCC)   Increased tracheal secretions   Tracheostomy status (HCC)   1. Acute on chronic respiratory failure hypoxia plan is to continue with T collar as tolerated continue secretion management supportive care 2. C3 spinal cord injury no change continue present management 3. Aspiration pneumonia treated clinically improving 4. Increase secretions ongoing pulmonary toilet 5. Tracheostomy remains in place   I have personally seen and evaluated the patient, evaluated laboratory and imaging results, formulated the assessment and plan and placed orders. The Patient requires high complexity decision making with multiple systems involvement.  Rounds  were done with the Respiratory Therapy Director and Staff therapists and discussed with nursing staff also.  Yevonne Pax, MD [facility] Pulmonary Critical Care Medicine Sleep Medicine

## 2020-01-24 DIAGNOSIS — J398 Other specified diseases of upper respiratory tract: Secondary | ICD-10-CM | POA: Diagnosis not present

## 2020-01-24 DIAGNOSIS — S14103S Unspecified injury at C3 level of cervical spinal cord, sequela: Secondary | ICD-10-CM | POA: Diagnosis not present

## 2020-01-24 DIAGNOSIS — J9621 Acute and chronic respiratory failure with hypoxia: Secondary | ICD-10-CM | POA: Diagnosis not present

## 2020-01-24 DIAGNOSIS — J690 Pneumonitis due to inhalation of food and vomit: Secondary | ICD-10-CM | POA: Diagnosis not present

## 2020-01-24 LAB — CULTURE, BLOOD (ROUTINE X 2)
Culture: NO GROWTH
Culture: NO GROWTH
Special Requests: ADEQUATE
Special Requests: ADEQUATE

## 2020-01-24 NOTE — Progress Notes (Signed)
Pulmonary Critical Care Medicine [facility] [place]   PULMONARY CRITICAL CARE SERVICE  PROGRESS NOTE  Date of Service: 01/24/2020  Anthony Andersen  QIO:[id number]  DOB: [birth year]   DOA: 01/14/2020  Referring Physician: Carron Curie, MD  HPI: Anthony Andersen is a 79 y.o. male seen for follow up of Acute on Chronic Respiratory Failure.  Patient currently is on T collar is comfortable right now without distress has been on 28% FiO2  Medications: Reviewed on Rounds  Physical Exam:  Vitals: Temperature is 98.7 pulse 120 respiratory 34 blood pressure is 113/66 saturations 95%  Ventilator Settings on T collar with an FiO2 of 28%  . General: Comfortable at this time . Eyes: Grossly normal lids, irises & conjunctiva . ENT: grossly tongue is normal . Neck: no obvious mass . Cardiovascular: S1 S2 normal no gallop . Respiratory: No rhonchi coarse breath sounds are noted . Abdomen: soft . Skin: no rash seen on limited exam . Musculoskeletal: not rigid . Psychiatric:unable to assess . Neurologic: no seizure no involuntary movements         Lab Data:   Basic Metabolic Panel: Recent Labs  Lab 01/19/20 0650 01/22/20 0418  NA 140 140  K 4.9 4.8  CL 100 99  CO2 29 30  GLUCOSE 179* 165*  BUN 52* 54*  CREATININE 0.79 0.65  CALCIUM 9.3 9.3  MG 2.0 2.2  PHOS 4.0 3.9    ABG: No results for input(s): PHART, PCO2ART, PO2ART, HCO3, O2SAT in the last 168 hours.  Liver Function Tests: No results for input(s): AST, ALT, ALKPHOS, BILITOT, PROT, ALBUMIN in the last 168 hours. No results for input(s): LIPASE, AMYLASE in the last 168 hours. No results for input(s): AMMONIA in the last 168 hours.  CBC: Recent Labs  Lab 01/19/20 0650 01/22/20 0418  WBC 10.4 10.1  HGB 8.3* 8.6*  HCT 27.1* 28.2*  MCV 92.5 91.9  PLT 329 357    Cardiac Enzymes: No results for input(s): CKTOTAL, CKMB, CKMBINDEX, TROPONINI in the last 168 hours.  BNP (last 3 results) No results for  input(s): BNP in the last 8760 hours.  ProBNP (last 3 results) No results for input(s): PROBNP in the last 8760 hours.  Radiological Exams: CT ABDOMEN WO CONTRAST  Result Date: 01/22/2020 CLINICAL DATA:  Respiratory failure, spinal cord injury and evaluation for possible percutaneous gastrostomy tube placement for nutrition. EXAM: CT ABDOMEN WITHOUT CONTRAST TECHNIQUE: Multidetector CT imaging of the abdomen was performed following the standard protocol without IV contrast. COMPARISON:  None. FINDINGS: Lower chest: Posterior right lower lobe atelectasis/consolidation and mild atelectasis at the left lung base. Hepatobiliary: No focal liver abnormality is seen. Calcified gallstone is present in the region of the neck of the gallbladder. No biliary dilatation. Pancreas: Unremarkable. No pancreatic ductal dilatation or surrounding inflammatory changes. Spleen: Normal in size without focal abnormality. Adrenals/Urinary Tract: Unenhanced appearance of the adrenal glands is unremarkable. There is a small nonobstructing calculus in the lower pole of the left kidney measuring up to 6 mm. Stomach/Bowel: No hiatal hernia. Feeding tube extends through the stomach and duodenum with the tip located in the proximal jejunum. The stomach is horizontal and fairly high in position, predominantly substernal and subxiphoid when decompressed. The stomach does lie superior to the transverse colon. There is no evidence of visible bowel obstruction, ileus, inflammatory process or free air. Vascular/Lymphatic: No significant vascular findings are present. No enlarged abdominal lymph nodes. Other: No hernias, ascites, focal fluid collections or body wall edema.  Musculoskeletal: No acute or significant osseous findings. IMPRESSION: 1. The stomach is horizontal and fairly high in position but does lie superior to the transverse colon. With air insufflation, a window for placement of a percutaneous gastrostomy tube should be present. 2.  Posterior right lower lobe atelectasis/consolidation and mild atelectasis at the left lung base. 3. Cholelithiasis. 4. Nonobstructing 6 mm calculus in the lower pole of the left kidney. Electronically Signed   By: Aletta Edouard M.D.   On: 01/22/2020 15:20    Assessment/Plan Active Problems:   Acute on chronic respiratory failure with hypoxia (HCC)   C3 spinal cord injury, sequela (HCC)   Aspiration pneumonia due to gastric secretions (HCC)   Increased tracheal secretions   Tracheostomy status ([place])   1. Acute on chronic respiratory failure hypoxia plan is to continue with T collar trials currently on 28% FiO2 spoke with respiratory therapy on rounds try PMV and then progressed to capping 2. C3 spinal cord injury no change supportive care 3. Aspiration pneumonia improving 4. Increased secretions today reportedly secretions are better 5. Tracheostomy we will try to proceed to PMV and capping   I have personally seen and evaluated the patient, evaluated laboratory and imaging results, formulated the assessment and plan and placed orders. The Patient requires high complexity decision making with multiple systems involvement.  Rounds were done with the Respiratory Therapy Director and Staff therapists and discussed with nursing staff also.  Allyne Gee, MD [facility] Pulmonary Critical Care Medicine Sleep Medicine

## 2020-01-24 NOTE — Consult Note (Signed)
Chief Complaint: Patient was seen in consultation today for percutaneous gastric tube placement at the request of Dr Sharyon Medicus   Supervising Physician: Oley Balm  Patient Status: [facility]  History of Present Illness: Anthony Andersen is a 79 y.o. male   Larey Seat and suffered C3 spinal injury Quadriplegia In [facility] for 1 mo Developed aspiration PNA Respiratory failure Hypoxia Transferred to [facility] for vent management Now on T collar/trach  Need for long term care Dysphagia Deconditioning Malnutrition  Request made for percutaneous gastric tube placement Imaging approved for same No G tubes in stock in [place] shortage--- cannot promise timing of placement  Past Medical History:  Diagnosis Date  . Acute on chronic respiratory failure with hypoxia (HCC)   . Anxiety    "at times"  . Arthritis   . Aspiration pneumonia due to gastric secretions (HCC)   . C3 spinal cord injury, sequela (HCC)   . Headache   . High cholesterol   . History of bronchitis as a child   . Hypertension   . Increased tracheal secretions   . Nocturia   . Tracheostomy status (HCC)   . Urinary frequency   . Wears glasses     Past Surgical History:  Procedure Laterality Date  . BACK SURGERY     x2  . COLONOSCOPY    . ESOPHAGOGASTRODUODENOSCOPY    . EYE SURGERY Bilateral    cataracts  . HEMORRHOID SURGERY    . KNEE ARTHROSCOPY Left   . LUMBAR LAMINECTOMY/DECOMPRESSION MICRODISCECTOMY N/A 09/06/2015   Procedure: Thorasic nine-eleven DECOMPRESSION;  Surgeon: Venita Lick, MD;  Location: [facility] OR;  Service: Orthopedics;  Laterality: N/A;    Allergies: Patient has no known allergies.  Medications: Prior to Admission medications   Medication Sig Start Date End Date Taking? Authorizing Provider  gabapentin (NEURONTIN) 300 MG capsule Take 300 mg by mouth 3 (three) times daily. 06/16/15   [provider]  methocarbamol (ROBAXIN) 500 MG tablet Take 1 tablet (500 mg total) by  mouth 3 (three) times daily as needed for muscle spasms. 09/06/15   Venita Lick, MD  Multiple Vitamins-Minerals (MULTIVITAMIN ADULT PO) Take 1 tablet by mouth daily.    [provider]  ondansetron (ZOFRAN) 4 MG tablet Take 1 tablet (4 mg total) by mouth every 8 (eight) hours as needed for nausea or vomiting. 09/06/15   Venita Lick, MD  oxyCODONE-acetaminophen (PERCOCET) 10-325 MG tablet Take 1 tablet by mouth every 4 (four) hours as needed for pain. 09/06/15   Venita Lick, MD  ZETIA 10 MG tablet Take 10 mg by mouth daily. 05/18/15   [provider]     Family History  Problem Relation Age of Onset  . Diabetes Mellitus I Unknown   . Hypertension Unknown   . Cancer Mother   . Cancer Father   . Cancer Maternal Grandmother   . Cancer Paternal Grandfather     Social History   Socioeconomic History  . Marital status: Married    Spouse name: Bonita Quin  . Number of children: 1  . Years of education: 3  . Highest education level: Not on file  Occupational History  . Occupation: Self-employed   Tobacco Use  . Smoking status: Never Smoker  Substance and Sexual Activity  . Alcohol use: Yes    Alcohol/week: 0.0 standard drinks    Comment: Occasional   . Drug use: No  . Sexual activity: Not on file  Other Topics Concern  . Not on file  Social  History Narrative   Lives at home with spouse.   Caffeine use: 1 cup coffee every morning   Social Determinants of Health   Financial Resource Strain:   . Difficulty of Paying Living Expenses:   Food Insecurity:   . Worried About Charity fundraiser in the Last Year:   . Arboriculturist in the Last Year:   Transportation Needs:   . Film/video editor (Medical):   Marland Kitchen Lack of Transportation (Non-Medical):   Physical Activity:   . Days of Exercise per Week:   . Minutes of Exercise per Session:   Stress:   . Feeling of Stress :   Social Connections:   . Frequency of Communication with Friends and Family:   .  Frequency of Social Gatherings with Friends and Family:   . Attends Religious Services:   . Active Member of Clubs or Organizations:   . Attends Archivist Meetings:   Marland Kitchen Marital Status:     Review of Systems: A 12 point ROS discussed and pertinent positives are indicated in the HPI above.  All other systems are negative.   Vital Signs: There were no vitals taken for this visit.  Physical Exam Vitals reviewed.  Cardiovascular:     Rate and Rhythm: Normal rate and regular rhythm.     Heart sounds: Normal heart sounds.  Pulmonary:     Comments: trach Musculoskeletal:     Comments: No movement  Skin:    General: Skin is warm and dry.  Neurological:     Comments: Can follow me in room Otherwise no respone  Psychiatric:     Comments: Consented with wife at bedside [place]: CT ABDOMEN WO CONTRAST  Result Date: 01/22/2020 CLINICAL DATA:  Respiratory failure, spinal cord injury and evaluation for possible percutaneous gastrostomy tube placement for nutrition. EXAM: CT ABDOMEN WITHOUT CONTRAST TECHNIQUE: Multidetector CT imaging of the abdomen was performed following the standard protocol without IV contrast. COMPARISON:  None. FINDINGS: Lower chest: Posterior right lower lobe atelectasis/consolidation and mild atelectasis at the left lung base. Hepatobiliary: No focal liver abnormality is seen. Calcified gallstone is present in the region of the neck of the gallbladder. No biliary dilatation. Pancreas: Unremarkable. No pancreatic ductal dilatation or surrounding inflammatory changes. Spleen: Normal in size without focal abnormality. Adrenals/Urinary Tract: Unenhanced appearance of the adrenal glands is unremarkable. There is a small nonobstructing calculus in the lower pole of the left kidney measuring up to 6 mm. Stomach/Bowel: No hiatal hernia. Feeding tube extends through the stomach and duodenum with the tip located in the proximal jejunum. The stomach is  horizontal and fairly high in position, predominantly substernal and subxiphoid when decompressed. The stomach does lie superior to the transverse colon. There is no evidence of visible bowel obstruction, ileus, inflammatory process or free air. Vascular/Lymphatic: No significant vascular findings are present. No enlarged abdominal lymph nodes. Other: No hernias, ascites, focal fluid collections or body wall edema. Musculoskeletal: No acute or significant osseous findings. IMPRESSION: 1. The stomach is horizontal and fairly high in position but does lie superior to the transverse colon. With air insufflation, a window for placement of a percutaneous gastrostomy tube should be present. 2. Posterior right lower lobe atelectasis/consolidation and mild atelectasis at the left lung base. 3. Cholelithiasis. 4. Nonobstructing 6 mm calculus in the lower pole of the left kidney. Electronically Signed   By: Aletta Edouard M.D.   On: 01/22/2020 15:20   DG CHEST  PORT 1 VIEW  Result Date: 01/22/2020 CLINICAL DATA:  Pneumonia. EXAM: PORTABLE CHEST 1 VIEW COMPARISON:  01/17/2020 FINDINGS: Tracheostomy tube in adequate position. Enteric tube courses to the stomach and proximal duodenum and off the film as tip is not visualized. Stable moderate elevation of the right hemidiaphragm. Minimal hazy prominence of the right perihilar vessels unchanged and may be due to minimal vascular congestion. Remainder the lungs are clear. Cardiomediastinal silhouette and remainder of the exam is unchanged. IMPRESSION: Stable elevation of the right hemidiaphragm with mild stable prominence of the right perihilar vessels suggesting minimal vascular congestion. Electronically Signed   By: Elberta Fortis M.D.   On: 01/22/2020 08:40   DG CHEST PORT 1 VIEW  Result Date: 01/17/2020 CLINICAL DATA:  Low-grade fever EXAM: PORTABLE CHEST 1 VIEW COMPARISON:  Three days ago FINDINGS: Feeding tube which at least reaches the first portion duodenum.  Tracheostomy tube in place Elevated right diaphragm with lower and middle lobe opacification collapse by chest CT 12/24/2019. The left lung is clear. Normal heart size. No visible effusion or pneumothorax. IMPRESSION: Unchanged hardware positioning and right base opacification with elevated diaphragm. Electronically Signed   By: Marnee Spring M.D.   On: 01/17/2020 07:05   DG CHEST PORT 1 VIEW  Result Date: 01/14/2020 CLINICAL DATA:  79 year old male status post NG placement. EXAM: PORTABLE CHEST 1 VIEW COMPARISON:  Chest radiograph dated 01/01/2020. FINDINGS: Tracheostomy above the carina. An enteric tube is noted extending below the diaphragm and to the right of the spine with tip beyond the inferior margin of the image. There is elevation of the right hemidiaphragm. Bibasilar atelectasis. No consolidative changes. There is no pleural effusion pneumothorax. There is mild cardiomegaly. No acute osseous pathology. A rounded calcific focus in the left upper quadrant, possibly vascular calcification. IMPRESSION: 1. Tracheostomy above the carina. 2. Enteric tube with tip beyond the inferior margin of the image. Electronically Signed   By: Elgie Collard M.D.   On: 01/14/2020 23:14   DG Abd Portable 1V  Result Date: 01/15/2020 CLINICAL DATA:  Feeding tube placement. EXAM: PORTABLE ABDOMEN - 1 VIEW COMPARISON:  None. FINDINGS: A feeding tube is seen with its distal tip overlying the expected region of the proximal duodenum. This is unchanged in position when compared to the prior study. The bowel gas pattern is normal. A 4 mm renal calculus is seen overlying the lower pole of the left kidney. IMPRESSION: Feeding tube positioning, as described above. Electronically Signed   By: Aram Candela M.D.   On: 01/15/2020 00:08    Labs:  CBC: Recent Labs    01/15/20 0523 01/17/20 0738 01/19/20 0650 01/22/20 0418  WBC 8.1 10.4 10.4 10.1  HGB 9.4* 9.0* 8.3* 8.6*  HCT 30.5* 29.3* 27.1* 28.2*  PLT 470*  420* 329 357    COAGS: No results for input(s): INR, APTT in the last 8760 hours.  BMP: Recent Labs    01/15/20 0523 01/17/20 0738 01/19/20 0650 01/22/20 0418  NA 144 141 140 140  K 4.0 3.8 4.9 4.8  CL 101 101 100 99  CO2 31 29 29 30   GLUCOSE 183* 173* 179* 165*  BUN 45* 35* 52* 54*  CALCIUM 9.4 9.2 9.3 9.3  CREATININE 0.72 0.74 0.79 0.65  GFRNONAA >60 >60 >60 >60  GFRAA >60 >60 >60 >60    LIVER FUNCTION TESTS: Recent Labs    01/15/20 0523  BILITOT 1.0  AST 45*  ALT 68*  ALKPHOS 80  PROT 7.3  ALBUMIN  2.1*    TUMOR MARKERS: No results for input(s): AFPTM, CEA, CA199, CHROMGRNA in the last 8760 hours.  Assessment and Plan:  C3 injury- quadriplegia Aspiration PNA Hypoxia; Resp failure Trach at this point Malnutrition/dysphagia Deconditioning Need for long ter care Scheduled for percutaneous gastric tube placement Risks and benefits image guided gastrostomy tube placement was discussed with the patient's wife Laurel Smeltz including, but not limited to the need for a barium enema during the procedure, bleeding, infection, peritonitis and/or damage to adjacent structures.  All questions were answered, she is agreeable to proceed. Consent signed and in chart.   Thank you for this interesting consult.  I greatly enjoyed meeting LON KLIPPEL and look forward to participating in their care.  A copy of this report was sent to the requesting provider on this date.  Electronically Signed: Robet Leu, PA-C 01/24/2020, 1:52 PM   I spent a total of 40 Minutes    in face to face in clinical consultation, greater than 50% of which was counseling/coordinating care for percutaneous gastric tube placement

## 2020-01-25 ENCOUNTER — Other Ambulatory Visit (HOSPITAL_COMMUNITY): Payer: Medicare Other

## 2020-01-25 DIAGNOSIS — J690 Pneumonitis due to inhalation of food and vomit: Secondary | ICD-10-CM | POA: Diagnosis not present

## 2020-01-25 DIAGNOSIS — J398 Other specified diseases of upper respiratory tract: Secondary | ICD-10-CM | POA: Diagnosis not present

## 2020-01-25 DIAGNOSIS — J9621 Acute and chronic respiratory failure with hypoxia: Secondary | ICD-10-CM | POA: Diagnosis not present

## 2020-01-25 DIAGNOSIS — S14103S Unspecified injury at C3 level of cervical spinal cord, sequela: Secondary | ICD-10-CM | POA: Diagnosis not present

## 2020-01-25 LAB — CBC
HCT: 34.4 % — ABNORMAL LOW (ref 39.0–52.0)
Hemoglobin: 10.2 g/dL — ABNORMAL LOW (ref 13.0–17.0)
MCH: 28.3 pg (ref 26.0–34.0)
MCHC: 29.7 g/dL — ABNORMAL LOW (ref 30.0–36.0)
MCV: 95.6 fL (ref 80.0–100.0)
Platelets: 747 10*3/uL — ABNORMAL HIGH (ref 150–400)
RBC: 3.6 MIL/uL — ABNORMAL LOW (ref 4.22–5.81)
RDW: 13.2 % (ref 11.5–15.5)
WBC: 19.2 10*3/uL — ABNORMAL HIGH (ref 4.0–10.5)
nRBC: 0 % (ref 0.0–0.2)

## 2020-01-25 LAB — BASIC METABOLIC PANEL
Anion gap: 10 (ref 5–15)
Anion gap: 11 (ref 5–15)
BUN: 55 mg/dL — ABNORMAL HIGH (ref 8–23)
BUN: 65 mg/dL — ABNORMAL HIGH (ref 8–23)
CO2: 36 mmol/L — ABNORMAL HIGH (ref 22–32)
CO2: 32 mmol/L (ref 22–32)
Calcium: 9.1 mg/dL (ref 8.9–10.3)
Calcium: 8.4 mg/dL — ABNORMAL LOW (ref 8.9–10.3)
Chloride: 95 mmol/L — ABNORMAL LOW (ref 98–111)
Chloride: 98 mmol/L (ref 98–111)
Creatinine, Ser: 0.85 mg/dL (ref 0.61–1.24)
Creatinine, Ser: 1.21 mg/dL (ref 0.61–1.24)
GFR calc Af Amer: >60 mL/min (ref >60)
GFR calc Af Amer: >60 mL/min (ref >60)
GFR calc non Af Amer: >60 mL/min (ref >60)
GFR calc non Af Amer: 57 mL/min — ABNORMAL LOW (ref >60)
Glucose, Bld: 228 mg/dL — ABNORMAL HIGH (ref 70–99)
Glucose, Bld: 182 mg/dL — ABNORMAL HIGH (ref 70–99)
Potassium: 6.4 mmol/L (ref 3.5–5.1)
Potassium: 5.4 mmol/L — ABNORMAL HIGH (ref 3.5–5.1)
Sodium: 141 mmol/L (ref 135–145)
Sodium: 141 mmol/L (ref 135–145)

## 2020-01-25 LAB — URINALYSIS, ROUTINE W REFLEX MICROSCOPIC
Bilirubin Urine: NEGATIVE
Glucose, UA: NEGATIVE mg/dL
Hgb urine dipstick: NEGATIVE
Ketones, ur: NEGATIVE mg/dL
Leukocytes,Ua: NEGATIVE
Nitrite: NEGATIVE
Protein, ur: 100 mg/dL — AB
Specific Gravity, Urine: 1.024 (ref 1.005–1.030)
pH: 5 (ref 5.0–8.0)

## 2020-01-25 LAB — BLOOD GAS, ARTERIAL
Acid-Base Excess: 8.4 mmol/L — ABNORMAL HIGH (ref 0.0–2.0)
Bicarbonate: 33.9 mmol/L — ABNORMAL HIGH (ref 20.0–28.0)
FIO2: 100
O2 Saturation: 99.2 %
Patient temperature: 39.3
pCO2 arterial: 68.6 mmHg (ref 32.0–48.0)
pH, Arterial: 7.328 — ABNORMAL LOW (ref 7.350–7.450)
pO2, Arterial: 205 mmHg — ABNORMAL HIGH (ref 83.0–108.0)

## 2020-01-25 LAB — MAGNESIUM: Magnesium: 2.5 mg/dL — ABNORMAL HIGH (ref 1.7–2.4)

## 2020-01-25 LAB — LACTIC ACID, PLASMA: Lactic Acid, Venous: 1.3 mmol/L (ref 0.5–1.9)

## 2020-01-25 NOTE — Progress Notes (Signed)
Pulmonary Critical Care Medicine [facility] [place]   PULMONARY CRITICAL CARE SERVICE  PROGRESS NOTE  Date of Service: 01/25/2020  Anthony Andersen  OEH:[id number]  DOB: [birth year]   DOA: 01/14/2020  Referring Physician: Carron Curie, MD  HPI: Anthony Andersen is a 79 y.o. male seen for follow up of Acute on Chronic Respiratory Failure.  Patient currently is on assist control mode has been on 80% FiO2 with good saturations.  Overnight apparently had desaturations also spiked fever appears the patient has pneumonia  Medications: Reviewed on Rounds  Physical Exam:  Vitals: Temperature is 101.2 pulse 98 respiratory rate 16 blood pressure is 122/57 saturations 98%  Ventilator Settings on assist control FiO2 is 80% tidal line 553 PEEP 6  . General: Comfortable at this time . Eyes: Grossly normal lids, irises & conjunctiva . ENT: grossly tongue is normal . Neck: no obvious mass . Cardiovascular: S1 S2 normal no gallop . Respiratory: Coarse breath sounds with few scattered rhonchi . Abdomen: soft . Skin: no rash seen on limited exam . Musculoskeletal: not rigid . Psychiatric:unable to assess . Neurologic: no seizure no involuntary movements         Lab Data:   Basic Metabolic Panel: Recent Labs  Lab 01/19/20 0650 01/22/20 0418 01/25/20 0522 01/25/20 1117  NA 140 140 141 141  K 4.9 4.8 6.4* 5.4*  CL 100 99 95* 98  CO2 29 30 36* 32  GLUCOSE 179* 165* 228* 182*  BUN 52* 54* 55* 65*  CREATININE 0.79 0.65 0.85 1.21  CALCIUM 9.3 9.3 9.1 8.4*  MG 2.0 2.2 2.5*  --   PHOS 4.0 3.9  --   --     ABG: Recent Labs  Lab 01/25/20 0805  PHART 7.328*  PCO2ART 68.6*  PO2ART 205*  HCO3 33.9*  O2SAT 99.2    Liver Function Tests: No results for input(s): AST, ALT, ALKPHOS, BILITOT, PROT, ALBUMIN in the last 168 hours. No results for input(s): LIPASE, AMYLASE in the last 168 hours. No results for input(s): AMMONIA in the last 168 hours.  CBC: Recent Labs  Lab  01/19/20 0650 01/22/20 0418 01/25/20 0522  WBC 10.4 10.1 19.2*  HGB 8.3* 8.6* 10.2*  HCT 27.1* 28.2* 34.4*  MCV 92.5 91.9 95.6  PLT 329 357 747*    Cardiac Enzymes: No results for input(s): CKTOTAL, CKMB, CKMBINDEX, TROPONINI in the last 168 hours.  BNP (last 3 results) No results for input(s): BNP in the last 8760 hours.  ProBNP (last 3 results) No results for input(s): PROBNP in the last 8760 hours.  Radiological Exams: DG CHEST PORT 1 VIEW  Result Date: 01/25/2020 CLINICAL DATA:  Respiratory distress. EXAM: PORTABLE CHEST 1 VIEW COMPARISON:  CT abdomen pelvis and chest x-ray dated Jan 22, 2020. FINDINGS: Unchanged tracheostomy and feeding tubes. The heart size and mediastinal contours are within normal limits. Insert vascular unchanged elevation of the right hemidiaphragm with continued patchy consolidation in right lower lobe. No pleural effusion or pneumothorax. No acute osseous abnormality. Prior cervicothoracic posterior fusion. IMPRESSION: 1. Persistent right lower lobe pneumonia. Electronically Signed   By: Obie Dredge M.D.   On: 01/25/2020 10:58    Assessment/Plan Active Problems:   Acute on chronic respiratory failure with hypoxia (HCC)   C3 spinal cord injury, sequela (HCC)   Aspiration pneumonia due to gastric secretions (HCC)   Increased tracheal secretions   Tracheostomy status (HCC)   1. Acute on chronic respiratory failure with hypoxia patient will be continued on  assist control.  The blood gas actually looks very good I have asked for respiratory therapy to wean FiO2 down quickly 2. C3 spinal cord injury no change continue with supportive care 3. Aspiration pneumonia.  Pneumonia is present on the chest film with fever present.  Patient's wife was in the room she was updated.  We will continue to but the patient resting the ventilator for now 4. Retained secretions we will continue with pulmonary toilet 5. Tracheostomy remains in place   I have personally  seen and evaluated the patient, evaluated laboratory and imaging results, formulated the assessment and plan and placed orders. The Patient requires high complexity decision making with multiple systems involvement.  Rounds were done with the Respiratory Therapy Director and Staff therapists and discussed with nursing staff also.  Allyne Gee, MD [facility] Pulmonary Critical Care Medicine Sleep Medicine

## 2020-01-26 DIAGNOSIS — S14103S Unspecified injury at C3 level of cervical spinal cord, sequela: Secondary | ICD-10-CM | POA: Diagnosis not present

## 2020-01-26 DIAGNOSIS — J398 Other specified diseases of upper respiratory tract: Secondary | ICD-10-CM | POA: Diagnosis not present

## 2020-01-26 DIAGNOSIS — J690 Pneumonitis due to inhalation of food and vomit: Secondary | ICD-10-CM | POA: Diagnosis not present

## 2020-01-26 DIAGNOSIS — J9621 Acute and chronic respiratory failure with hypoxia: Secondary | ICD-10-CM | POA: Diagnosis not present

## 2020-01-26 LAB — CBC
HCT: 25 % — ABNORMAL LOW (ref 39.0–52.0)
Hemoglobin: 7.4 g/dL — ABNORMAL LOW (ref 13.0–17.0)
MCH: 28.5 pg (ref 26.0–34.0)
MCHC: 29.6 g/dL — ABNORMAL LOW (ref 30.0–36.0)
MCV: 96.2 fL (ref 80.0–100.0)
Platelets: 380 10*3/uL (ref 150–400)
RBC: 2.6 MIL/uL — ABNORMAL LOW (ref 4.22–5.81)
RDW: 13.2 % (ref 11.5–15.5)
WBC: 9.4 10*3/uL (ref 4.0–10.5)
nRBC: 0 % (ref 0.0–0.2)

## 2020-01-26 LAB — VANCOMYCIN, TROUGH: Vancomycin Tr: 21 ug/mL (ref 15–20)

## 2020-01-26 LAB — URINE CULTURE: Culture: NO GROWTH

## 2020-01-26 LAB — RENAL FUNCTION PANEL
Albumin: 1.7 g/dL — ABNORMAL LOW (ref 3.5–5.0)
Anion gap: 12 (ref 5–15)
BUN: 64 mg/dL — ABNORMAL HIGH (ref 8–23)
CO2: 30 mmol/L (ref 22–32)
Calcium: 8.1 mg/dL — ABNORMAL LOW (ref 8.9–10.3)
Chloride: 102 mmol/L (ref 98–111)
Creatinine, Ser: 0.75 mg/dL (ref 0.61–1.24)
GFR calc Af Amer: >60 mL/min (ref >60)
GFR calc non Af Amer: >60 mL/min (ref >60)
Glucose, Bld: 134 mg/dL — ABNORMAL HIGH (ref 70–99)
Phosphorus: 2.9 mg/dL (ref 2.5–4.6)
Potassium: 4 mmol/L (ref 3.5–5.1)
Sodium: 144 mmol/L (ref 135–145)

## 2020-01-26 LAB — OCCULT BLOOD X 1 CARD TO LAB, STOOL: Fecal Occult Bld: NEGATIVE

## 2020-01-26 LAB — MAGNESIUM: Magnesium: 2.4 mg/dL (ref 1.7–2.4)

## 2020-01-26 NOTE — Progress Notes (Addendum)
Pulmonary Critical Care Medicine [place]   PULMONARY CRITICAL CARE SERVICE  PROGRESS NOTE  Date of Service: 01/26/2020  Anthony Andersen  VFI:[id number]  DOB: [birth year]   DOA: 01/14/2020  Referring Physician: Merton Border, MD  HPI: Anthony Andersen is a 79 y.o. male seen for follow up of Acute on Chronic Respiratory Failure.  Patient remains on full support on the ventilator assist control mode rate of 16 with an FiO2 of 40% currently satting well no distress.  Medications: Reviewed on Rounds  Physical Exam:  Vitals: Pulse 116 respirations 20 BP 111/58 O2 sat 100% temp 99.1  Ventilator Settings ventilator mode AC VC rate of 16 tidal volume 500 PEEP of 5 FiO2 40%  . General: Comfortable at this time . Eyes: Grossly normal lids, irises & conjunctiva . ENT: grossly tongue is normal . Neck: no obvious mass . Cardiovascular: S1 S2 normal no gallop . Respiratory: No rales or rhonchi noted . Abdomen: soft . Skin: no rash seen on limited exam . Musculoskeletal: not rigid . Psychiatric:unable to assess . Neurologic: no seizure no involuntary movements         Lab Data:   Basic Metabolic Panel: Recent Labs  Lab 01/22/20 0418 01/25/20 0522 01/25/20 1117 01/26/20 0721  NA 140 141 141 144  K 4.8 6.4* 5.4* 4.0  CL 99 95* 98 102  CO2 30 36* 32 30  GLUCOSE 165* 228* 182* 134*  BUN 54* 55* 65* 64*  CREATININE 0.65 0.85 1.21 0.75  CALCIUM 9.3 9.1 8.4* 8.1*  MG 2.2 2.5*  --  2.4  PHOS 3.9  --   --  2.9    ABG: Recent Labs  Lab 01/25/20 0805  PHART 7.328*  PCO2ART 68.6*  PO2ART 205*  HCO3 33.9*  O2SAT 99.2    Liver Function Tests: Recent Labs  Lab 01/26/20 0721  ALBUMIN 1.7*   No results for input(s): LIPASE, AMYLASE in the last 168 hours. No results for input(s): AMMONIA in the last 168 hours.  CBC: Recent Labs  Lab 01/22/20 0418 01/25/20 0522 01/26/20 0721  WBC 10.1 19.2* 9.4  HGB 8.6* 10.2* 7.4*  HCT 28.2* 34.4* 25.0*  MCV 91.9 95.6  96.2  PLT 357 747* 380    Cardiac Enzymes: No results for input(s): CKTOTAL, CKMB, CKMBINDEX, TROPONINI in the last 168 hours.  BNP (last 3 results) No results for input(s): BNP in the last 8760 hours.  ProBNP (last 3 results) No results for input(s): PROBNP in the last 8760 hours.  Radiological Exams: DG CHEST PORT 1 VIEW  Result Date: 01/25/2020 CLINICAL DATA:  Respiratory distress. EXAM: PORTABLE CHEST 1 VIEW COMPARISON:  CT abdomen pelvis and chest x-ray dated Jan 22, 2020. FINDINGS: Unchanged tracheostomy and feeding tubes. The heart size and mediastinal contours are within normal limits. Insert vascular unchanged elevation of the right hemidiaphragm with continued patchy consolidation in right lower lobe. No pleural effusion or pneumothorax. No acute osseous abnormality. Prior cervicothoracic posterior fusion. IMPRESSION: 1. Persistent right lower lobe pneumonia. Electronically Signed   By: Titus Dubin M.D.   On: 01/25/2020 10:58    Assessment/Plan Active Problems:   Acute on chronic respiratory failure with hypoxia (HCC)   C3 spinal cord injury, sequela (HCC)   Aspiration pneumonia due to gastric secretions (HCC)   Increased tracheal secretions   Tracheostomy status ([place])   1. Acute on chronic respiratory failure with hypoxia patient will be continued on assist control.  Currently requiring 40% FiO2 we will  continue to attempt to wean FiO2.  Continue supportive measures and pulmonary toilet. 2. C3 spinal cord injury no change continue with supportive care 3. Aspiration pneumonia.  Pneumonia is present on the chest film with fever present.  Patient's wife was in the room she was updated.  We will continue to but the patient resting the ventilator for now 4. Retained secretions we will continue with pulmonary toilet 5. Tracheostomy remains in place   I have personally seen and evaluated the patient, evaluated laboratory and imaging results, formulated the assessment and plan  and placed orders. The Patient requires high complexity decision making with multiple systems involvement.  Rounds were done with the Respiratory Therapy Director and Staff therapists and discussed with nursing staff also.  Yevonne Pax, MD [facility] Pulmonary Critical Care Medicine Sleep Medicine

## 2020-01-27 DIAGNOSIS — J398 Other specified diseases of upper respiratory tract: Secondary | ICD-10-CM | POA: Diagnosis not present

## 2020-01-27 DIAGNOSIS — J690 Pneumonitis due to inhalation of food and vomit: Secondary | ICD-10-CM | POA: Diagnosis not present

## 2020-01-27 DIAGNOSIS — S14103S Unspecified injury at C3 level of cervical spinal cord, sequela: Secondary | ICD-10-CM | POA: Diagnosis not present

## 2020-01-27 DIAGNOSIS — J9621 Acute and chronic respiratory failure with hypoxia: Secondary | ICD-10-CM | POA: Diagnosis not present

## 2020-01-27 LAB — CBC
HCT: 23.5 % — ABNORMAL LOW (ref 39.0–52.0)
Hemoglobin: 6.9 g/dL — CL (ref 13.0–17.0)
MCH: 28 pg (ref 26.0–34.0)
MCHC: 29.4 g/dL — ABNORMAL LOW (ref 30.0–36.0)
MCV: 95.5 fL (ref 80.0–100.0)
Platelets: 366 10*3/uL (ref 150–400)
RBC: 2.46 MIL/uL — ABNORMAL LOW (ref 4.22–5.81)
RDW: 13.5 % (ref 11.5–15.5)
WBC: 8 10*3/uL (ref 4.0–10.5)
nRBC: 0 % (ref 0.0–0.2)

## 2020-01-27 LAB — PREPARE RBC (CROSSMATCH)

## 2020-01-27 LAB — CULTURE, RESPIRATORY W GRAM STAIN

## 2020-01-27 LAB — ABO/RH: ABO/RH(D): A POS

## 2020-01-27 LAB — VANCOMYCIN, TROUGH: Vancomycin Tr: 23 ug/mL (ref 15–20)

## 2020-01-27 NOTE — Progress Notes (Signed)
Pulmonary Critical Care Medicine [facility] [place]   PULMONARY CRITICAL CARE SERVICE  PROGRESS NOTE  Date of Service: 01/27/2020  Anthony Andersen  MIW:[id number]  DOB: 28-May-1941   DOA: 01/14/2020  Referring Physician: Carron Curie, MD  HPI: Anthony Andersen is a 79 y.o. male seen for follow up of Acute on Chronic Respiratory Failure.  Patient currently is on pressure support has been on 40% FiO2 currently with a goal of 4 hours  Medications: Reviewed on Rounds  Physical Exam:  Vitals: Temperature is 100.3 pulse 104 respiratory 21 blood pressure is 105/54 saturations 100%  Ventilator Settings on pressure support FiO2 is 40% pressure 12 PEEP 5  . General: Comfortable at this time . Eyes: Grossly normal lids, irises & conjunctiva . ENT: grossly tongue is normal . Neck: no obvious mass . Cardiovascular: S1 S2 normal no gallop . Respiratory: No rhonchi no rales are noted at this time . Abdomen: soft . Skin: no rash seen on limited exam . Musculoskeletal: not rigid . Psychiatric:unable to assess . Neurologic: no seizure no involuntary movements         Lab Data:   Basic Metabolic Panel: Recent Labs  Lab 01/22/20 0418 01/25/20 0522 01/25/20 1117 01/26/20 0721  NA 140 141 141 144  K 4.8 6.4* 5.4* 4.0  CL 99 95* 98 102  CO2 30 36* 32 30  GLUCOSE 165* 228* 182* 134*  BUN 54* 55* 65* 64*  CREATININE 0.65 0.85 1.21 0.75  CALCIUM 9.3 9.1 8.4* 8.1*  MG 2.2 2.5*  --  2.4  PHOS 3.9  --   --  2.9    ABG: Recent Labs  Lab 01/25/20 0805  PHART 7.328*  PCO2ART 68.6*  PO2ART 205*  HCO3 33.9*  O2SAT 99.2    Liver Function Tests: Recent Labs  Lab 01/26/20 0721  ALBUMIN 1.7*   No results for input(s): LIPASE, AMYLASE in the last 168 hours. No results for input(s): AMMONIA in the last 168 hours.  CBC: Recent Labs  Lab 01/22/20 0418 01/25/20 0522 01/26/20 0721 01/27/20 0527  WBC 10.1 19.2* 9.4 8.0  HGB 8.6* 10.2* 7.4* 6.9*  HCT 28.2* 34.4* 25.0* 23.5*   MCV 91.9 95.6 96.2 95.5  PLT 357 747* 380 366    Cardiac Enzymes: No results for input(s): CKTOTAL, CKMB, CKMBINDEX, TROPONINI in the last 168 hours.  BNP (last 3 results) No results for input(s): BNP in the last 8760 hours.  ProBNP (last 3 results) No results for input(s): PROBNP in the last 8760 hours.  Radiological Exams: DG CHEST PORT 1 VIEW  Result Date: 01/25/2020 CLINICAL DATA:  Respiratory distress. EXAM: PORTABLE CHEST 1 VIEW COMPARISON:  CT abdomen pelvis and chest x-ray dated Jan 22, 2020. FINDINGS: Unchanged tracheostomy and feeding tubes. The heart size and mediastinal contours are within normal limits. Insert vascular unchanged elevation of the right hemidiaphragm with continued patchy consolidation in right lower lobe. No pleural effusion or pneumothorax. No acute osseous abnormality. Prior cervicothoracic posterior fusion. IMPRESSION: 1. Persistent right lower lobe pneumonia. Electronically Signed   By: Obie Dredge M.D.   On: 01/25/2020 10:58    Assessment/Plan Active Problems:   Acute on chronic respiratory failure with hypoxia (HCC)   C3 spinal cord injury, sequela (HCC)   Aspiration pneumonia due to gastric secretions (HCC)   Increased tracheal secretions   Tracheostomy status (HCC)   1. Acute on chronic respiratory failure hypoxia we will continue with pressure support titrate oxygen continue pulmonary toilet right now  is on a goal of 4 hours 2. C3 spinal cord injury no change continue with supportive care 3. Aspiration pneumonia treated clinically is improving but chest x-ray still showing some persistence of infiltrate 4. Increased tracheal secretions needs ongoing aggressive pulmonary toilet 5. Tracheostomy remains in place   I have personally seen and evaluated the patient, evaluated laboratory and imaging results, formulated the assessment and plan and placed orders. The Patient requires high complexity decision making with multiple systems  involvement.  Rounds were done with the Respiratory Therapy Director and Staff therapists and discussed with nursing staff also.  Allyne Gee, MD [facility] Pulmonary Critical Care Medicine Sleep Medicine

## 2020-01-28 DIAGNOSIS — S14103S Unspecified injury at C3 level of cervical spinal cord, sequela: Secondary | ICD-10-CM | POA: Diagnosis not present

## 2020-01-28 DIAGNOSIS — J9621 Acute and chronic respiratory failure with hypoxia: Secondary | ICD-10-CM | POA: Diagnosis not present

## 2020-01-28 DIAGNOSIS — J398 Other specified diseases of upper respiratory tract: Secondary | ICD-10-CM | POA: Diagnosis not present

## 2020-01-28 DIAGNOSIS — J690 Pneumonitis due to inhalation of food and vomit: Secondary | ICD-10-CM | POA: Diagnosis not present

## 2020-01-28 LAB — TYPE AND SCREEN
ABO/RH(D): A POS
Antibody Screen: NEGATIVE
Unit division: 0
Unit division: 0

## 2020-01-28 LAB — RENAL FUNCTION PANEL
Albumin: 1.7 g/dL — ABNORMAL LOW (ref 3.5–5.0)
Anion gap: 10 (ref 5–15)
BUN: 49 mg/dL — ABNORMAL HIGH (ref 8–23)
CO2: 28 mmol/L (ref 22–32)
Calcium: 8.2 mg/dL — ABNORMAL LOW (ref 8.9–10.3)
Chloride: 105 mmol/L (ref 98–111)
Creatinine, Ser: 0.55 mg/dL — ABNORMAL LOW (ref 0.61–1.24)
GFR calc Af Amer: >60 mL/min (ref >60)
GFR calc non Af Amer: >60 mL/min (ref >60)
Glucose, Bld: 151 mg/dL — ABNORMAL HIGH (ref 70–99)
Phosphorus: 2.8 mg/dL (ref 2.5–4.6)
Potassium: 5.4 mmol/L — ABNORMAL HIGH (ref 3.5–5.1)
Sodium: 143 mmol/L (ref 135–145)

## 2020-01-28 LAB — PREPARE RBC (CROSSMATCH)

## 2020-01-28 LAB — BPAM RBC
Blood Product Expiration Date: 202105112359
Blood Product Expiration Date: 202105132359
ISSUE DATE / TIME: 202105061231
Unit Type and Rh: 600
Unit Type and Rh: 600

## 2020-01-28 LAB — CBC
HCT: 24.5 % — ABNORMAL LOW (ref 39.0–52.0)
Hemoglobin: 7.5 g/dL — ABNORMAL LOW (ref 13.0–17.0)
MCH: 28.7 pg (ref 26.0–34.0)
MCHC: 30.6 g/dL (ref 30.0–36.0)
MCV: 93.9 fL (ref 80.0–100.0)
Platelets: 391 10*3/uL (ref 150–400)
RBC: 2.61 MIL/uL — ABNORMAL LOW (ref 4.22–5.81)
RDW: 14.6 % (ref 11.5–15.5)
WBC: 6.5 10*3/uL (ref 4.0–10.5)
nRBC: 0 % (ref 0.0–0.2)

## 2020-01-28 LAB — OCCULT BLOOD X 1 CARD TO LAB, STOOL: Fecal Occult Bld: NEGATIVE

## 2020-01-28 NOTE — Progress Notes (Addendum)
Pulmonary Critical Care Medicine [facility] [place]   PULMONARY CRITICAL CARE SERVICE  PROGRESS NOTE  Date of Service: 01/28/2020  Anthony Andersen  QIO:[id number]  DOB: 10-Nov-1940   DOA: 01/14/2020  Referring Physician: Carron Curie, MD  HPI: Anthony Andersen is a 79 y.o. male seen for follow up of Acute on Chronic Respiratory Failure.  Patient was able to do 4 hours on pressure support today 40% FiO2 is now resting back on full support on the ventilator satting well no fever or distress.  Medications: Reviewed on Rounds  Physical Exam:  Vitals: Pulse 91% 16 BP 114/61 O2 sat % 99.9  Ventilator Settings assist control rate of 16 tidal volume 500 PEEP of 5 FiO2 40%  . General: Comfortable at this time . Eyes: Grossly normal lids, irises & conjunctiva . ENT: grossly tongue is normal . Neck: no obvious mass . Cardiovascular: S1 S2 normal no gallop . Respiratory: No rales or rhonchi noted . Abdomen: soft . Skin: no rash seen on limited exam . Musculoskeletal: not rigid . Psychiatric:unable to assess . Neurologic: no seizure no involuntary movements         Lab Data:   Basic Metabolic Panel: Recent Labs  Lab 01/22/20 0418 01/25/20 0522 01/25/20 1117 01/26/20 0721 01/28/20 0835  NA 140 141 141 144 143  K 4.8 6.4* 5.4* 4.0 5.4*  CL 99 95* 98 102 105  CO2 30 36* 32 30 28  GLUCOSE 165* 228* 182* 134* 151*  BUN 54* 55* 65* 64* 49*  CREATININE 0.65 0.85 1.21 0.75 0.55*  CALCIUM 9.3 9.1 8.4* 8.1* 8.2*  MG 2.2 2.5*  --  2.4  --   PHOS 3.9  --   --  2.9 2.8    ABG: Recent Labs  Lab 01/25/20 0805  PHART 7.328*  PCO2ART 68.6*  PO2ART 205*  HCO3 33.9*  O2SAT 99.2    Liver Function Tests: Recent Labs  Lab 01/26/20 0721 01/28/20 0835  ALBUMIN 1.7* 1.7*   No results for input(s): LIPASE, AMYLASE in the last 168 hours. No results for input(s): AMMONIA in the last 168 hours.  CBC: Recent Labs  Lab 01/22/20 0418 01/25/20 0522 01/26/20 0721  01/27/20 0527 01/28/20 0835  WBC 10.1 19.2* 9.4 8.0 6.5  HGB 8.6* 10.2* 7.4* 6.9* 7.5*  HCT 28.2* 34.4* 25.0* 23.5* 24.5*  MCV 91.9 95.6 96.2 95.5 93.9  PLT 357 747* 380 366 391    Cardiac Enzymes: No results for input(s): CKTOTAL, CKMB, CKMBINDEX, TROPONINI in the last 168 hours.  BNP (last 3 results) No results for input(s): BNP in the last 8760 hours.  ProBNP (last 3 results) No results for input(s): PROBNP in the last 8760 hours.  Radiological Exams: No results found.  Assessment/Plan Active Problems:   Acute on chronic respiratory failure with hypoxia (HCC)   C3 spinal cord injury, sequela (HCC)   Aspiration pneumonia due to gastric secretions (HCC)   Increased tracheal secretions   Tracheostomy status (HCC)   1. Acute on chronic respiratory failure hypoxia w patient had successful 4-hour wean today to pressure support 8/5 with FiO2 of 40% is now resting back on the full support on the ventilator.  We will continue to wean per protocol. 2. C3 spinal cord injury no change continue with supportive care 3. Aspiration pneumonia treated clinically is improving but chest x-ray still showing some persistence of infiltrate 4. Increased tracheal secretions needs ongoing aggressive pulmonary toilet 5. Tracheostomy remains in place   I have  personally seen and evaluated the patient, evaluated laboratory and imaging results, formulated the assessment and plan and placed orders. The Patient requires high complexity decision making with multiple systems involvement.  Rounds were done with the Respiratory Therapy Director and Staff therapists and discussed with nursing staff also.  Allyne Gee, MD [facility] Pulmonary Critical Care Medicine Sleep Medicine

## 2020-01-29 ENCOUNTER — Other Ambulatory Visit (HOSPITAL_COMMUNITY): Payer: Medicare Other

## 2020-01-29 DIAGNOSIS — J690 Pneumonitis due to inhalation of food and vomit: Secondary | ICD-10-CM | POA: Diagnosis not present

## 2020-01-29 DIAGNOSIS — Z930 Tracheostomy status: Secondary | ICD-10-CM | POA: Diagnosis not present

## 2020-01-29 DIAGNOSIS — J9621 Acute and chronic respiratory failure with hypoxia: Secondary | ICD-10-CM | POA: Diagnosis not present

## 2020-01-29 DIAGNOSIS — J398 Other specified diseases of upper respiratory tract: Secondary | ICD-10-CM | POA: Diagnosis not present

## 2020-01-29 LAB — CBC
HCT: 26.7 % — ABNORMAL LOW (ref 39.0–52.0)
Hemoglobin: 8.1 g/dL — ABNORMAL LOW (ref 13.0–17.0)
MCH: 28.5 pg (ref 26.0–34.0)
MCHC: 30.3 g/dL (ref 30.0–36.0)
MCV: 94 fL (ref 80.0–100.0)
Platelets: 343 10*3/uL (ref 150–400)
RBC: 2.84 MIL/uL — ABNORMAL LOW (ref 4.22–5.81)
RDW: 14.1 % (ref 11.5–15.5)
WBC: 6.6 10*3/uL (ref 4.0–10.5)
nRBC: 0 % (ref 0.0–0.2)

## 2020-01-29 LAB — MAGNESIUM: Magnesium: 2 mg/dL (ref 1.7–2.4)

## 2020-01-29 LAB — BASIC METABOLIC PANEL
Anion gap: 8 (ref 5–15)
BUN: 40 mg/dL — ABNORMAL HIGH (ref 8–23)
CO2: 28 mmol/L (ref 22–32)
Calcium: 8.1 mg/dL — ABNORMAL LOW (ref 8.9–10.3)
Chloride: 103 mmol/L (ref 98–111)
Creatinine, Ser: 0.49 mg/dL — ABNORMAL LOW (ref 0.61–1.24)
GFR calc Af Amer: >60 mL/min (ref >60)
GFR calc non Af Amer: >60 mL/min (ref >60)
Glucose, Bld: 165 mg/dL — ABNORMAL HIGH (ref 70–99)
Potassium: 4.9 mmol/L (ref 3.5–5.1)
Sodium: 139 mmol/L (ref 135–145)

## 2020-01-29 LAB — VANCOMYCIN, TROUGH: Vancomycin Tr: 4 ug/mL — ABNORMAL LOW (ref 15–20)

## 2020-01-29 LAB — CK: Total CK: 70 U/L (ref 49–397)

## 2020-01-29 NOTE — Progress Notes (Signed)
Pulmonary Critical Care Medicine [facility] [place]   PULMONARY CRITICAL CARE SERVICE  PROGRESS NOTE  Date of Service: 01/29/2020  Anthony Andersen  FYB:[id number]  DOB: 07-Sep-1941   DOA: 01/14/2020  Referring Physician: Carron Curie, MD  HPI: Anthony Andersen is a 79 y.o. male seen for follow up of Acute on Chronic Respiratory Failure.  Patient currently is on pressure support has been on 28% FiO2 right now is on 12/5 the goal is for 12 hours  Medications: Reviewed on Rounds  Physical Exam:  Vitals: Temperature is 97.8 pulse 90 respiratory 18 blood pressure is one 1/55 saturations 98%  Ventilator Settings on pressure support FiO2 28% pressure poor 12/5  . General: Comfortable at this time . Eyes: Grossly normal lids, irises & conjunctiva . ENT: grossly tongue is normal . Neck: no obvious mass . Cardiovascular: S1 S2 normal no gallop . Respiratory: No rhonchi coarse breath sounds are noted at this time . Abdomen: soft . Skin: no rash seen on limited exam . Musculoskeletal: not rigid . Psychiatric:unable to assess . Neurologic: no seizure no involuntary movements         Lab Data:   Basic Metabolic Panel: Recent Labs  Lab 01/25/20 0522 01/25/20 1117 01/26/20 0721 01/28/20 0835 01/29/20 1057  NA 141 141 144 143 139  K 6.4* 5.4* 4.0 5.4* 4.9  CL 95* 98 102 105 103  CO2 36* 32 30 28 28   GLUCOSE 228* 182* 134* 151* 165*  BUN 55* 65* 64* 49* 40*  CREATININE 0.85 1.21 0.75 0.55* 0.49*  CALCIUM 9.1 8.4* 8.1* 8.2* 8.1*  MG 2.5*  --  2.4  --  2.0  PHOS  --   --  2.9 2.8  --     ABG: Recent Labs  Lab 01/25/20 0805  PHART 7.328*  PCO2ART 68.6*  PO2ART 205*  HCO3 33.9*  O2SAT 99.2    Liver Function Tests: Recent Labs  Lab 01/26/20 0721 01/28/20 0835  ALBUMIN 1.7* 1.7*   No results for input(s): LIPASE, AMYLASE in the last 168 hours. No results for input(s): AMMONIA in the last 168 hours.  CBC: Recent Labs  Lab 01/25/20 0522 01/26/20 0721  01/27/20 0527 01/28/20 0835 01/29/20 1057  WBC 19.2* 9.4 8.0 6.5 6.6  HGB 10.2* 7.4* 6.9* 7.5* 8.1*  HCT 34.4* 25.0* 23.5* 24.5* 26.7*  MCV 95.6 96.2 95.5 93.9 94.0  PLT 747* 380 366 391 343    Cardiac Enzymes: Recent Labs  Lab 01/29/20 1057  CKTOTAL 70    BNP (last 3 results) No results for input(s): BNP in the last 8760 hours.  ProBNP (last 3 results) No results for input(s): PROBNP in the last 8760 hours.  Radiological Exams: CT ABDOMEN PELVIS WO CONTRAST  Result Date: 01/29/2020 CLINICAL DATA:  Low hemoglobin. Concern for hemorrhage. Spinal cord injury EXAM: CT ABDOMEN AND PELVIS WITHOUT CONTRAST TECHNIQUE: Multidetector CT imaging of the abdomen and pelvis was performed following the standard protocol without IV contrast. COMPARISON:  CT abdomen 01/22/2020 FINDINGS: Lower chest: Dense RIGHT basilar atelectasis and effusion which is similar comparison exam. Hepatobiliary: Gallbladder is distended to 5 cm. There is a stone within the neck of the gallbladder. Findings similar comparison exam. Common bile duct normal caliber. Pancreas: Pancreas is normal. No ductal dilatation. No pancreatic inflammation. Spleen: Normal spleen Adrenals/urinary tract: Adrenal glands normal. Kidneys normal on nonenhanced CT. Nonobstructing LEFT renal calculus. Ureters normal. Foley catheter within the bladder. Stomach/Bowel: Feeding tube extends through the stomach into the duodenum  the level of ligament Treitz. Small bowel normal. Appendix and cecum normal. The colon and rectosigmoid colon are normal. Vascular/Lymphatic: Abdominal aorta is normal caliber with atherosclerotic calcification. There is no retroperitoneal or periportal lymphadenopathy. No pelvic lymphadenopathy. Reproductive: Penile prosthetic noted. Prostate normal. Penile prosthetic reservoirs in the anterior pelvis Other: No retroperitoneal or intraperitoneal hemorrhage present. Musculoskeletal: No aggressive osseous lesion. Flowing  osteophytosis. IMPRESSION: 1. No intraperitoneal or retroperitoneal hemorrhage. 2. Dense atelectasis in the RIGHT lower lobe with small effusion unchanged. 3. Distension of the gallbladder likely related to fasting state. 4. Cholelithiasis. 5. Feeding tube extends to the ligament Treitz. 6. LEFT nonobstructing renal calculus. Electronically Signed   By: Suzy Bouchard M.D.   On: 01/29/2020 05:12    Assessment/Plan Active Problems:   Acute on chronic respiratory failure with hypoxia (HCC)   C3 spinal cord injury, sequela (HCC)   Aspiration pneumonia due to gastric secretions (HCC)   Increased tracheal secretions   Tracheostomy status ([place])   1. Acute on chronic respiratory failure hypoxia we will continue with pressure support titrate oxygen continue pulmonary toilet. 2. C3 spinal cord injury no change we will continue to follow along 3. Aspiration pneumonia treated we will continue with supportive care 4. Retained tracheal secretions continue aggressive pulmonary toilet 5. Tracheostomy remains in place   I have personally seen and evaluated the patient, evaluated laboratory and imaging results, formulated the assessment and plan and placed orders. The Patient requires high complexity decision making with multiple systems involvement.  Rounds were done with the Respiratory Therapy Director and Staff therapists and discussed with nursing staff also.  Allyne Gee, MD [facility] Pulmonary Critical Care Medicine Sleep Medicine

## 2020-01-30 DIAGNOSIS — Z930 Tracheostomy status: Secondary | ICD-10-CM | POA: Diagnosis not present

## 2020-01-30 DIAGNOSIS — J9621 Acute and chronic respiratory failure with hypoxia: Secondary | ICD-10-CM | POA: Diagnosis not present

## 2020-01-30 DIAGNOSIS — J398 Other specified diseases of upper respiratory tract: Secondary | ICD-10-CM | POA: Diagnosis not present

## 2020-01-30 DIAGNOSIS — J690 Pneumonitis due to inhalation of food and vomit: Secondary | ICD-10-CM | POA: Diagnosis not present

## 2020-01-30 LAB — CULTURE, BLOOD (ROUTINE X 2)
Culture: NO GROWTH
Culture: NO GROWTH
Special Requests: ADEQUATE
Special Requests: ADEQUATE

## 2020-01-30 LAB — VANCOMYCIN, TROUGH: Vancomycin Tr: 13 ug/mL — ABNORMAL LOW (ref 15–20)

## 2020-01-30 NOTE — Progress Notes (Signed)
Pulmonary Critical Care Medicine [facility] [place]   PULMONARY CRITICAL CARE SERVICE  PROGRESS NOTE  Date of Service: 01/30/2020  Anthony Andersen  ZOX:[id number]  DOB: 06-24-1941   DOA: 01/14/2020  Referring Physician: Carron Curie, MD  HPI: Anthony Andersen is a 79 y.o. male seen for follow up of Acute on Chronic Respiratory Failure.  Patient currently is on T collar has been on 28% FiO2 today the goal is for about 2 hours  Medications: Reviewed on Rounds  Physical Exam:  Vitals: Temperature 97.4 pulse 85 respiratory 24 blood pressure is 114/65 saturations 99%  Ventilator Settings on T collar FiO2 28%  . General: Comfortable at this time . Eyes: Grossly normal lids, irises & conjunctiva . ENT: grossly tongue is normal . Neck: no obvious mass . Cardiovascular: S1 S2 normal no gallop . Respiratory: No rhonchi no rales are noted . Abdomen: soft . Skin: no rash seen on limited exam . Musculoskeletal: not rigid . Psychiatric:unable to assess . Neurologic: no seizure no involuntary movements         Lab Data:   Basic Metabolic Panel: Recent Labs  Lab 01/25/20 0522 01/25/20 1117 01/26/20 0721 01/28/20 0835 01/29/20 1057  NA 141 141 144 143 139  K 6.4* 5.4* 4.0 5.4* 4.9  CL 95* 98 102 105 103  CO2 36* 32 30 28 28   GLUCOSE 228* 182* 134* 151* 165*  BUN 55* 65* 64* 49* 40*  CREATININE 0.85 1.21 0.75 0.55* 0.49*  CALCIUM 9.1 8.4* 8.1* 8.2* 8.1*  MG 2.5*  --  2.4  --  2.0  PHOS  --   --  2.9 2.8  --     ABG: Recent Labs  Lab 01/25/20 0805  PHART 7.328*  PCO2ART 68.6*  PO2ART 205*  HCO3 33.9*  O2SAT 99.2    Liver Function Tests: Recent Labs  Lab 01/26/20 0721 01/28/20 0835  ALBUMIN 1.7* 1.7*   No results for input(s): LIPASE, AMYLASE in the last 168 hours. No results for input(s): AMMONIA in the last 168 hours.  CBC: Recent Labs  Lab 01/25/20 0522 01/26/20 0721 01/27/20 0527 01/28/20 0835 01/29/20 1057  WBC 19.2* 9.4 8.0 6.5 6.6  HGB  10.2* 7.4* 6.9* 7.5* 8.1*  HCT 34.4* 25.0* 23.5* 24.5* 26.7*  MCV 95.6 96.2 95.5 93.9 94.0  PLT 747* 380 366 391 343    Cardiac Enzymes: Recent Labs  Lab 01/29/20 1057  CKTOTAL 70    BNP (last 3 results) No results for input(s): BNP in the last 8760 hours.  ProBNP (last 3 results) No results for input(s): PROBNP in the last 8760 hours.  Radiological Exams: CT ABDOMEN PELVIS WO CONTRAST  Result Date: 01/29/2020 CLINICAL DATA:  Low hemoglobin. Concern for hemorrhage. Spinal cord injury EXAM: CT ABDOMEN AND PELVIS WITHOUT CONTRAST TECHNIQUE: Multidetector CT imaging of the abdomen and pelvis was performed following the standard protocol without IV contrast. COMPARISON:  CT abdomen 01/22/2020 FINDINGS: Lower chest: Dense RIGHT basilar atelectasis and effusion which is similar comparison exam. Hepatobiliary: Gallbladder is distended to 5 cm. There is a stone within the neck of the gallbladder. Findings similar comparison exam. Common bile duct normal caliber. Pancreas: Pancreas is normal. No ductal dilatation. No pancreatic inflammation. Spleen: Normal spleen Adrenals/urinary tract: Adrenal glands normal. Kidneys normal on nonenhanced CT. Nonobstructing LEFT renal calculus. Ureters normal. Foley catheter within the bladder. Stomach/Bowel: Feeding tube extends through the stomach into the duodenum the level of ligament Treitz. Small bowel normal. Appendix and cecum normal.  The colon and rectosigmoid colon are normal. Vascular/Lymphatic: Abdominal aorta is normal caliber with atherosclerotic calcification. There is no retroperitoneal or periportal lymphadenopathy. No pelvic lymphadenopathy. Reproductive: Penile prosthetic noted. Prostate normal. Penile prosthetic reservoirs in the anterior pelvis Other: No retroperitoneal or intraperitoneal hemorrhage present. Musculoskeletal: No aggressive osseous lesion. Flowing osteophytosis. IMPRESSION: 1. No intraperitoneal or retroperitoneal hemorrhage. 2. Dense  atelectasis in the RIGHT lower lobe with small effusion unchanged. 3. Distension of the gallbladder likely related to fasting state. 4. Cholelithiasis. 5. Feeding tube extends to the ligament Treitz. 6. LEFT nonobstructing renal calculus. Electronically Signed   By: Suzy Bouchard M.D.   On: 01/29/2020 05:12    Assessment/Plan Active Problems:   Acute on chronic respiratory failure with hypoxia (HCC)   C3 spinal cord injury, sequela (HCC)   Aspiration pneumonia due to gastric secretions (HCC)   Increased tracheal secretions   Tracheostomy status ([place])   1. Acute on chronic respiratory failure hypoxia we will continue with T collar trials titrate oxygen continue pulmonary toilet. 2. C3 spinal cord injury at baseline continue to follow 3. Aspiration pneumonia treated clinically improving 4. Retained tracheal secretions continue aggressive pulmonary toilet 5. Tracheostomy remains in place we will continue to monitor   I have personally seen and evaluated the patient, evaluated laboratory and imaging results, formulated the assessment and plan and placed orders. The Patient requires high complexity decision making with multiple systems involvement.  Rounds were done with the Respiratory Therapy Director and Staff therapists and discussed with nursing staff also.  Allyne Gee, MD [facility] Pulmonary Critical Care Medicine Sleep Medicine

## 2020-01-31 DIAGNOSIS — Z930 Tracheostomy status: Secondary | ICD-10-CM | POA: Diagnosis not present

## 2020-01-31 DIAGNOSIS — J9621 Acute and chronic respiratory failure with hypoxia: Secondary | ICD-10-CM | POA: Diagnosis not present

## 2020-01-31 DIAGNOSIS — J398 Other specified diseases of upper respiratory tract: Secondary | ICD-10-CM | POA: Diagnosis not present

## 2020-01-31 DIAGNOSIS — J690 Pneumonitis due to inhalation of food and vomit: Secondary | ICD-10-CM | POA: Diagnosis not present

## 2020-01-31 LAB — MAGNESIUM: Magnesium: 1.9 mg/dL (ref 1.7–2.4)

## 2020-01-31 LAB — CBC
HCT: 27.2 % — ABNORMAL LOW (ref 39.0–52.0)
Hemoglobin: 8.3 g/dL — ABNORMAL LOW (ref 13.0–17.0)
MCH: 28.1 pg (ref 26.0–34.0)
MCHC: 30.5 g/dL (ref 30.0–36.0)
MCV: 92.2 fL (ref 80.0–100.0)
Platelets: 371 10*3/uL (ref 150–400)
RBC: 2.95 MIL/uL — ABNORMAL LOW (ref 4.22–5.81)
RDW: 14.3 % (ref 11.5–15.5)
WBC: 7.8 10*3/uL (ref 4.0–10.5)
nRBC: 0 % (ref 0.0–0.2)

## 2020-01-31 LAB — RENAL FUNCTION PANEL
Albumin: 1.7 g/dL — ABNORMAL LOW (ref 3.5–5.0)
Anion gap: 11 (ref 5–15)
BUN: 35 mg/dL — ABNORMAL HIGH (ref 8–23)
CO2: 25 mmol/L (ref 22–32)
Calcium: 8.3 mg/dL — ABNORMAL LOW (ref 8.9–10.3)
Chloride: 104 mmol/L (ref 98–111)
Creatinine, Ser: 0.53 mg/dL — ABNORMAL LOW (ref 0.61–1.24)
GFR calc Af Amer: >60 mL/min (ref >60)
GFR calc non Af Amer: >60 mL/min (ref >60)
Glucose, Bld: 140 mg/dL — ABNORMAL HIGH (ref 70–99)
Phosphorus: 3.6 mg/dL (ref 2.5–4.6)
Potassium: 4.1 mmol/L (ref 3.5–5.1)
Sodium: 140 mmol/L (ref 135–145)

## 2020-01-31 NOTE — Progress Notes (Signed)
Pulmonary Critical Care Medicine [facility] [place]   PULMONARY CRITICAL CARE SERVICE  PROGRESS NOTE  Date of Service: 01/31/2020  Anthony Andersen  WJX:[id number]  DOB: 01-31-1941   DOA: 01/14/2020  Referring Physician: Carron Curie, MD  HPI: Anthony Andersen is a 79 y.o. male seen for follow up of Acute on Chronic Respiratory Failure.  Patient currently is on assist control has been on 20% FiO2 with good saturations currently is on a PEEP of 5  Medications: Reviewed on Rounds  Physical Exam:  Vitals: Temperature 99.3 pulse 78 respiratory rate 18 blood pressure is 129/79 saturations 100%  Ventilator Settings on assist control FiO2 28% tidal volume is 525 PEEP 5  . General: Comfortable at this time . Eyes: Grossly normal lids, irises & conjunctiva . ENT: grossly tongue is normal . Neck: no obvious mass . Cardiovascular: S1 S2 normal no gallop . Respiratory: No rhonchi coarse breath sounds are noted . Abdomen: soft . Skin: no rash seen on limited exam . Musculoskeletal: not rigid . Psychiatric:unable to assess . Neurologic: no seizure no involuntary movements         Lab Data:   Basic Metabolic Panel: Recent Labs  Lab 01/25/20 0522 01/25/20 0522 01/25/20 1117 01/26/20 0721 01/28/20 0835 01/29/20 1057 01/31/20 0627  NA 141   < > 141 144 143 139 140  K 6.4*   < > 5.4* 4.0 5.4* 4.9 4.1  CL 95*   < > 98 102 105 103 104  CO2 36*   < > 32 30 28 28 25   GLUCOSE 228*   < > 182* 134* 151* 165* 140*  BUN 55*   < > 65* 64* 49* 40* 35*  CREATININE 0.85   < > 1.21 0.75 0.55* 0.49* 0.53*  CALCIUM 9.1   < > 8.4* 8.1* 8.2* 8.1* 8.3*  MG 2.5*  --   --  2.4  --  2.0 1.9  PHOS  --   --   --  2.9 2.8  --  3.6   < > = values in this interval not displayed.    ABG: Recent Labs  Lab 01/25/20 0805  PHART 7.328*  PCO2ART 68.6*  PO2ART 205*  HCO3 33.9*  O2SAT 99.2    Liver Function Tests: Recent Labs  Lab 01/26/20 0721 01/28/20 0835 01/31/20 0627  ALBUMIN 1.7*  1.7* 1.7*   No results for input(s): LIPASE, AMYLASE in the last 168 hours. No results for input(s): AMMONIA in the last 168 hours.  CBC: Recent Labs  Lab 01/26/20 0721 01/27/20 0527 01/28/20 0835 01/29/20 1057 01/31/20 0627  WBC 9.4 8.0 6.5 6.6 7.8  HGB 7.4* 6.9* 7.5* 8.1* 8.3*  HCT 25.0* 23.5* 24.5* 26.7* 27.2*  MCV 96.2 95.5 93.9 94.0 92.2  PLT 380 366 391 343 371    Cardiac Enzymes: Recent Labs  Lab 01/29/20 1057  CKTOTAL 70    BNP (last 3 results) No results for input(s): BNP in the last 8760 hours.  ProBNP (last 3 results) No results for input(s): PROBNP in the last 8760 hours.  Radiological Exams: No results found.  Assessment/Plan Active Problems:   Acute on chronic respiratory failure with hypoxia (HCC)   C3 spinal cord injury, sequela (HCC)   Aspiration pneumonia due to gastric secretions (HCC)   Increased tracheal secretions   Tracheostomy status (HCC)   1. Acute on chronic respiratory failure hypoxia we will continue with T collar trials titrate oxygen continue pulmonary toilet. 2. C3 spinal cord injury  no change we will continue with current management 3. Aspiration pneumonia treated improved 4. Retained tracheal secretions continue aggressive pulmonary toilet 5. Tracheostomy remains in place   I have personally seen and evaluated the patient, evaluated laboratory and imaging results, formulated the assessment and plan and placed orders. The Patient requires high complexity decision making with multiple systems involvement.  Rounds were done with the Respiratory Therapy Director and Staff therapists and discussed with nursing staff also.  Allyne Gee, MD [facility] Pulmonary Critical Care Medicine Sleep Medicine

## 2020-02-01 DIAGNOSIS — J9621 Acute and chronic respiratory failure with hypoxia: Secondary | ICD-10-CM | POA: Diagnosis not present

## 2020-02-01 DIAGNOSIS — J398 Other specified diseases of upper respiratory tract: Secondary | ICD-10-CM | POA: Diagnosis not present

## 2020-02-01 DIAGNOSIS — J690 Pneumonitis due to inhalation of food and vomit: Secondary | ICD-10-CM | POA: Diagnosis not present

## 2020-02-01 DIAGNOSIS — S14103S Unspecified injury at C3 level of cervical spinal cord, sequela: Secondary | ICD-10-CM | POA: Diagnosis not present

## 2020-02-01 NOTE — Progress Notes (Signed)
Pulmonary Critical Care Medicine [facility] [place]   PULMONARY CRITICAL CARE SERVICE  PROGRESS NOTE  Date of Service: 02/01/2020  IZICK GASBARRO  [id number]  DOB: [birth year]   DOA: 01/14/2020  Referring Physician: Carron Curie, MD  HPI: Anthony Andersen is a 79 y.o. male seen for follow up of Acute on Chronic Respiratory Failure.  Patient currently is on T collar is comfortable right now without distress at this time with a goal of about 8 hours today  Medications: Reviewed on Rounds  Physical Exam:  Vitals: Temperature 98.0 pulse 91 respiratory rate 20 blood pressure is 122/70 saturations 100%  Ventilator Settings on T collar with an FiO2 of 28%  . General: Comfortable at this time . Eyes: Grossly normal lids, irises & conjunctiva . ENT: grossly tongue is normal . Neck: no obvious mass . Cardiovascular: S1 S2 normal no gallop . Respiratory: No rhonchi coarse breath sounds . Abdomen: soft . Skin: no rash seen on limited exam . Musculoskeletal: not rigid . Psychiatric:unable to assess . Neurologic: no seizure no involuntary movements         Lab Data:   Basic Metabolic Panel: Recent Labs  Lab 01/26/20 0721 01/28/20 0835 01/29/20 1057 01/31/20 0627  NA 144 143 139 140  K 4.0 5.4* 4.9 4.1  CL 102 105 103 104  CO2 30 28 28 25   GLUCOSE 134* 151* 165* 140*  BUN 64* 49* 40* 35*  CREATININE 0.75 0.55* 0.49* 0.53*  CALCIUM 8.1* 8.2* 8.1* 8.3*  MG 2.4  --  2.0 1.9  PHOS 2.9 2.8  --  3.6    ABG: No results for input(s): PHART, PCO2ART, PO2ART, HCO3, O2SAT in the last 168 hours.  Liver Function Tests: Recent Labs  Lab 01/26/20 0721 01/28/20 0835 01/31/20 0627  ALBUMIN 1.7* 1.7* 1.7*   No results for input(s): LIPASE, AMYLASE in the last 168 hours. No results for input(s): AMMONIA in the last 168 hours.  CBC: Recent Labs  Lab 01/26/20 0721 01/27/20 0527 01/28/20 0835 01/29/20 1057 01/31/20 0627  WBC 9.4 8.0 6.5 6.6 7.8  HGB 7.4* 6.9* 7.5*  8.1* 8.3*  HCT 25.0* 23.5* 24.5* 26.7* 27.2*  MCV 96.2 95.5 93.9 94.0 92.2  PLT 380 366 391 343 371    Cardiac Enzymes: Recent Labs  Lab 01/29/20 1057  CKTOTAL 70    BNP (last 3 results) No results for input(s): BNP in the last 8760 hours.  ProBNP (last 3 results) No results for input(s): PROBNP in the last 8760 hours.  Radiological Exams: No results found.  Assessment/Plan Active Problems:   Acute on chronic respiratory failure with hypoxia (HCC)   C3 spinal cord injury, sequela (HCC)   Aspiration pneumonia due to gastric secretions (HCC)   Increased tracheal secretions   Tracheostomy status (HCC)   1. Acute on chronic respiratory failure hypoxia we will continue with T collar trials today the goal should be 8 hours. 2. C3 spinal cord injury we will continue present management 3. Aspiration pneumonia treated we will continue to follow along 4. Retained secretions continue pulmonary toilet 5. Tracheostomy remains in place   I have personally seen and evaluated the patient, evaluated laboratory and imaging results, formulated the assessment and plan and placed orders. The Patient requires high complexity decision making with multiple systems involvement.  Rounds were done with the Respiratory Therapy Director and Staff therapists and discussed with nursing staff also.  03/30/20, MD [facility] Pulmonary Critical Care Medicine Sleep Medicine

## 2020-02-02 ENCOUNTER — Other Ambulatory Visit (HOSPITAL_COMMUNITY): Payer: Medicare Other

## 2020-02-02 DIAGNOSIS — S14103S Unspecified injury at C3 level of cervical spinal cord, sequela: Secondary | ICD-10-CM | POA: Diagnosis not present

## 2020-02-02 DIAGNOSIS — J9621 Acute and chronic respiratory failure with hypoxia: Secondary | ICD-10-CM | POA: Diagnosis not present

## 2020-02-02 DIAGNOSIS — J398 Other specified diseases of upper respiratory tract: Secondary | ICD-10-CM | POA: Diagnosis not present

## 2020-02-02 DIAGNOSIS — J690 Pneumonitis due to inhalation of food and vomit: Secondary | ICD-10-CM | POA: Diagnosis not present

## 2020-02-02 HISTORY — PX: IR GASTROSTOMY TUBE MOD SED: IMG625

## 2020-02-02 LAB — PROTIME-INR
INR: 1.2 (ref 0.8–1.2)
Prothrombin Time: 14.4 seconds (ref 11.4–15.2)

## 2020-02-02 MED ORDER — FENTANYL CITRATE (PF) 100 MCG/2ML IJ SOLN
INTRAMUSCULAR | Status: AC
  Filled 2020-02-02: qty 2

## 2020-02-02 MED ORDER — LIDOCAINE HCL 1 % IJ SOLN
INTRAMUSCULAR | Status: AC | PRN
  Administered 2020-02-02: 10 mL

## 2020-02-02 MED ORDER — LIDOCAINE HCL 1 % IJ SOLN
INTRAMUSCULAR | Status: AC
  Filled 2020-02-02: qty 20

## 2020-02-02 MED ORDER — GLUCAGON HCL RDNA (DIAGNOSTIC) 1 MG IJ SOLR
INTRAMUSCULAR | Status: AC
  Filled 2020-02-02: qty 1

## 2020-02-02 MED ORDER — GLUCAGON HCL RDNA (DIAGNOSTIC) 1 MG IJ SOLR
INTRAMUSCULAR | Status: AC | PRN
  Administered 2020-02-02: .5 mg via INTRAVENOUS

## 2020-02-02 MED ORDER — FENTANYL CITRATE (PF) 100 MCG/2ML IJ SOLN
INTRAMUSCULAR | Status: AC | PRN
  Administered 2020-02-02: 50 ug via INTRAVENOUS

## 2020-02-02 MED ORDER — MIDAZOLAM HCL 2 MG/2ML IJ SOLN
INTRAMUSCULAR | Status: AC
  Filled 2020-02-02: qty 2

## 2020-02-02 MED ORDER — IOHEXOL 300 MG/ML  SOLN
50.0000 mL | Freq: Once | INTRAMUSCULAR | Status: AC | PRN
  Administered 2020-02-02: 10 mL

## 2020-02-02 MED ORDER — MIDAZOLAM HCL 2 MG/2ML IJ SOLN
INTRAMUSCULAR | Status: AC | PRN
  Administered 2020-02-02: 1 mg via INTRAVENOUS

## 2020-02-02 NOTE — Progress Notes (Signed)
Pulmonary Critical Care Medicine [facility] [place]   PULMONARY CRITICAL CARE SERVICE  PROGRESS NOTE  Date of Service: 02/02/2020  Anthony Andersen  GDJ:[id number]  DOB: 1941-04-28   DOA: 01/14/2020  Referring Physician: Carron Curie, MD  HPI: Anthony Andersen is a 79 y.o. male seen for follow up of Acute on Chronic Respiratory Failure.  Patient currently is on T collar with a goal of 12 hours today right now is requiring 28% FiO2  Medications: Reviewed on Rounds  Physical Exam:  Vitals: Temperature is 97.9 pulse 92 respiratory 17 blood pressure is 105/71 saturations 100%  Ventilator Settings on T collar FiO2 28%  . General: Comfortable at this time . Eyes: Grossly normal lids, irises & conjunctiva . ENT: grossly tongue is normal . Neck: no obvious mass . Cardiovascular: S1 S2 normal no gallop . Respiratory: No rhonchi no rales are noted at this time . Abdomen: soft . Skin: no rash seen on limited exam . Musculoskeletal: not rigid . Psychiatric:unable to assess . Neurologic: no seizure no involuntary movements         Lab Data:   Basic Metabolic Panel: Recent Labs  Lab 01/28/20 0835 01/29/20 1057 01/31/20 0627  NA 143 139 140  K 5.4* 4.9 4.1  CL 105 103 104  CO2 28 28 25   GLUCOSE 151* 165* 140*  BUN 49* 40* 35*  CREATININE 0.55* 0.49* 0.53*  CALCIUM 8.2* 8.1* 8.3*  MG  --  2.0 1.9  PHOS 2.8  --  3.6    ABG: No results for input(s): PHART, PCO2ART, PO2ART, HCO3, O2SAT in the last 168 hours.  Liver Function Tests: Recent Labs  Lab 01/28/20 0835 01/31/20 0627  ALBUMIN 1.7* 1.7*   No results for input(s): LIPASE, AMYLASE in the last 168 hours. No results for input(s): AMMONIA in the last 168 hours.  CBC: Recent Labs  Lab 01/27/20 0527 01/28/20 0835 01/29/20 1057 01/31/20 0627  WBC 8.0 6.5 6.6 7.8  HGB 6.9* 7.5* 8.1* 8.3*  HCT 23.5* 24.5* 26.7* 27.2*  MCV 95.5 93.9 94.0 92.2  PLT 366 391 343 371    Cardiac Enzymes: Recent Labs  Lab  01/29/20 1057  CKTOTAL 70    BNP (last 3 results) No results for input(s): BNP in the last 8760 hours.  ProBNP (last 3 results) No results for input(s): PROBNP in the last 8760 hours.  Radiological Exams: No results found.  Assessment/Plan Active Problems:   Acute on chronic respiratory failure with hypoxia (HCC)   C3 spinal cord injury, sequela (HCC)   Aspiration pneumonia due to gastric secretions (HCC)   Increased tracheal secretions   Tracheostomy status (HCC)   1. Acute on chronic respiratory failure with hypoxia we will continue with T collar trials titrate oxygen continue pulmonary toilet. 2. C3 spinal cord injury no change we will continue to follow 3. Aspiration pneumonia treated clinically improving 4. Retained tracheal secretions at baseline continue to follow 5. Tracheostomy remains in place   I have personally seen and evaluated the patient, evaluated laboratory and imaging results, formulated the assessment and plan and placed orders. The Patient requires high complexity decision making with multiple systems involvement.  Rounds were done with the Respiratory Therapy Director and Staff therapists and discussed with nursing staff also.  03/30/20, MD [facility] Pulmonary Critical Care Medicine Sleep Medicine

## 2020-02-02 NOTE — Procedures (Signed)
  Procedure: Percutaneous gastrostomy catheter placed 67f EBL:   minimal Complications:  none immediate  See full dictation in [employer].  Thora Lance MD Main # [phone] Pager  [phone]

## 2020-02-03 DIAGNOSIS — J9621 Acute and chronic respiratory failure with hypoxia: Secondary | ICD-10-CM | POA: Diagnosis not present

## 2020-02-03 DIAGNOSIS — J690 Pneumonitis due to inhalation of food and vomit: Secondary | ICD-10-CM | POA: Diagnosis not present

## 2020-02-03 DIAGNOSIS — J398 Other specified diseases of upper respiratory tract: Secondary | ICD-10-CM | POA: Diagnosis not present

## 2020-02-03 DIAGNOSIS — Z930 Tracheostomy status: Secondary | ICD-10-CM | POA: Diagnosis not present

## 2020-02-03 LAB — CBC
HCT: 26.8 % — ABNORMAL LOW (ref 39.0–52.0)
Hemoglobin: 8.2 g/dL — ABNORMAL LOW (ref 13.0–17.0)
MCH: 27.9 pg (ref 26.0–34.0)
MCHC: 30.6 g/dL (ref 30.0–36.0)
MCV: 91.2 fL (ref 80.0–100.0)
Platelets: 366 10*3/uL (ref 150–400)
RBC: 2.94 MIL/uL — ABNORMAL LOW (ref 4.22–5.81)
RDW: 15.3 % (ref 11.5–15.5)
WBC: 6.6 10*3/uL (ref 4.0–10.5)
nRBC: 0 % (ref 0.0–0.2)

## 2020-02-03 LAB — BASIC METABOLIC PANEL
Anion gap: 12 (ref 5–15)
BUN: 26 mg/dL — ABNORMAL HIGH (ref 8–23)
CO2: 28 mmol/L (ref 22–32)
Calcium: 8.4 mg/dL — ABNORMAL LOW (ref 8.9–10.3)
Chloride: 99 mmol/L (ref 98–111)
Creatinine, Ser: 0.43 mg/dL — ABNORMAL LOW (ref 0.61–1.24)
GFR calc Af Amer: >60 mL/min (ref >60)
GFR calc non Af Amer: >60 mL/min (ref >60)
Glucose, Bld: 92 mg/dL (ref 70–99)
Potassium: 3.5 mmol/L (ref 3.5–5.1)
Sodium: 139 mmol/L (ref 135–145)

## 2020-02-03 LAB — VANCOMYCIN, TROUGH: Vancomycin Tr: 19 ug/mL (ref 15–20)

## 2020-02-03 LAB — PHOSPHORUS: Phosphorus: 4.2 mg/dL (ref 2.5–4.6)

## 2020-02-03 LAB — MAGNESIUM: Magnesium: 1.5 mg/dL — ABNORMAL LOW (ref 1.7–2.4)

## 2020-02-03 NOTE — Progress Notes (Signed)
Pulmonary Critical Care Medicine [place]   PULMONARY CRITICAL CARE SERVICE  PROGRESS NOTE  Date of Service: 02/03/2020  Anthony Andersen  [id number]  DOB: 28-May-1941   DOA: 01/14/2020  Referring Physician: Merton Border, MD  HPI: Anthony Andersen is a 79 y.o. male seen for follow up of Acute on Chronic Respiratory Failure.  Patient currently is on 28% FiO2 patient has been on 16-hour goal  Medications: Reviewed on Rounds  Physical Exam:  Vitals: Temperature is 97.6 pulse 80 respiratory 18 blood pressure is 125/70 saturations 100%  Ventilator Settings on T collar 28%  . General: Comfortable at this time . Eyes: Grossly normal lids, irises & conjunctiva . ENT: grossly tongue is normal . Neck: no obvious mass . Cardiovascular: S1 S2 normal no gallop . Respiratory: No rhonchi no rales are noted . Abdomen: soft . Skin: no rash seen on limited exam . Musculoskeletal: not rigid . Psychiatric:unable to assess . Neurologic: no seizure no involuntary movements         Lab Data:   Basic Metabolic Panel: Recent Labs  Lab 01/28/20 0835 01/29/20 1057 01/31/20 0627 02/03/20 0830  NA 143 139 140 139  K 5.4* 4.9 4.1 3.5  CL 105 103 104 99  CO2 28 28 25 28   GLUCOSE 151* 165* 140* 92  BUN 49* 40* 35* 26*  CREATININE 0.55* 0.49* 0.53* 0.43*  CALCIUM 8.2* 8.1* 8.3* 8.4*  MG  --  2.0 1.9 1.5*  PHOS 2.8  --  3.6 4.2    ABG: No results for input(s): PHART, PCO2ART, PO2ART, HCO3, O2SAT in the last 168 hours.  Liver Function Tests: Recent Labs  Lab 01/28/20 0835 01/31/20 0627  ALBUMIN 1.7* 1.7*   No results for input(s): LIPASE, AMYLASE in the last 168 hours. No results for input(s): AMMONIA in the last 168 hours.  CBC: Recent Labs  Lab 01/28/20 0835 01/29/20 1057 01/31/20 0627 02/03/20 0830  WBC 6.5 6.6 7.8 6.6  HGB 7.5* 8.1* 8.3* 8.2*  HCT 24.5* 26.7* 27.2* 26.8*  MCV 93.9 94.0 92.2 91.2  PLT 391 343 371 366    Cardiac Enzymes: Recent  Labs  Lab 01/29/20 1057  CKTOTAL 70    BNP (last 3 results) No results for input(s): BNP in the last 8760 hours.  ProBNP (last 3 results) No results for input(s): PROBNP in the last 8760 hours.  Radiological Exams: IR GASTROSTOMY TUBE MOD SED  Result Date: 02/02/2020 CLINICAL DATA:  Spinal cord injury, needs enteral feeding support EXAM: PERC PLACEMENT GASTROSTOMY FLUOROSCOPY TIME:  1.2 minute; 395  uGym2 DAP TECHNIQUE: The procedure, risks, benefits, and alternatives were explained to the patient. Questions regarding the procedure were encouraged and answered. The patient understands and consents to the procedure. The patient was already receiving adequate prophylactic vancomycin antibiotic coverage. Appropriate safe percutaneous approach was noted on recent CT. A 5 French angiographic catheter was placed as orogastric tube. The upper abdomen was prepped with Betadine, draped in usual sterile fashion, and infiltrated locally with 1% lidocaine. Intravenous Fentanyl 57mcg and Versed 1mg  were administered as conscious sedation during continuous monitoring of the patient's level of consciousness and physiological / cardiorespiratory status by the radiology RN, with a total moderate sedation time of 10 minutes. Stomach was insufflated using air through the orogastric tube. An 61 French sheath needle was advanced percutaneously into the gastric lumen under fluoroscopy. Gas could be aspirated and a small contrast injection confirmed intraluminal spread. The sheath was exchanged over a guidewire  for a 9 [place] vascular sheath, through which the snare device was advanced and used to snare a guidewire passed through the orogastric tube. This was withdrawn, and the snare attached to the 20 French pull-through gastrostomy tube, which was advanced antegrade, positioned with the internal bumper securing the anterior gastric wall to the anterior abdominal wall. Small contrast injection confirms appropriate  positioning. The external bumper was applied and the catheter was flushed. COMPLICATIONS: COMPLICATIONS none IMPRESSION: 1. Technically successful 20 French pull-through gastrostomy placement under fluoroscopy. Electronically Signed   By: Corlis Leak M.D.   On: 02/02/2020 15:20    Assessment/Plan Active Problems:   Acute on chronic respiratory failure with hypoxia (HCC)   C3 spinal cord injury, sequela (HCC)   Aspiration pneumonia due to gastric secretions (HCC)   Increased tracheal secretions   Tracheostomy status (HCC)   1. Acute on chronic respiratory failure hypoxia we will continue with T collar trials titrate oxygen continue pulmonary toilet.  Patient goal is 16 hours 2. C3 spinal cord injury no change continue present management 3. Aspiration pneumonia treated we will continue to follow 4. Retained tracheal secretions at baseline continue present management 5. Tracheostomy remains in place   I have personally seen and evaluated the patient, evaluated laboratory and imaging results, formulated the assessment and plan and placed orders. The Patient requires high complexity decision making with multiple systems involvement.  Rounds were done with the Respiratory Therapy Director and Staff therapists and discussed with nursing staff also.  Yevonne Pax, MD [facility] Pulmonary Critical Care Medicine Sleep Medicine

## 2020-02-03 NOTE — Progress Notes (Signed)
   G tube placed in IR yesterday 'tube intact Site is clean and dry NT no bleeding +BS  May use tube now

## 2020-02-04 DIAGNOSIS — J398 Other specified diseases of upper respiratory tract: Secondary | ICD-10-CM | POA: Diagnosis not present

## 2020-02-04 DIAGNOSIS — J9621 Acute and chronic respiratory failure with hypoxia: Secondary | ICD-10-CM | POA: Diagnosis not present

## 2020-02-04 DIAGNOSIS — Z930 Tracheostomy status: Secondary | ICD-10-CM | POA: Diagnosis not present

## 2020-02-04 DIAGNOSIS — J690 Pneumonitis due to inhalation of food and vomit: Secondary | ICD-10-CM | POA: Diagnosis not present

## 2020-02-04 NOTE — Progress Notes (Addendum)
Pulmonary Critical Care Medicine [facility] [place]   PULMONARY CRITICAL CARE SERVICE  PROGRESS NOTE  Date of Service: 02/04/2020  EASTIN SWING  DVV:[id number]  DOB: 1941-06-19   DOA: 01/14/2020  Referring Physician: Carron Curie, MD  HPI: Anthony Andersen is a 79 y.o. male seen for follow up of Acute on Chronic Respiratory Failure.  Patient continues on aerosol trach collar 28% for goal of 20 hours today satting well at this time no distress.  Medications: Reviewed on Rounds  Physical Exam:  Vitals: Pulse 78 respirations 22 BP 114/63 O2 sat 98% temp 97.7  Ventilator Settings ATC 28%  . General: Comfortable at this time . Eyes: Grossly normal lids, irises & conjunctiva . ENT: grossly tongue is normal . Neck: no obvious mass . Cardiovascular: S1 S2 normal no gallop . Respiratory: No rales or rhonchi noted . Abdomen: soft . Skin: no rash seen on limited exam . Musculoskeletal: not rigid . Psychiatric:unable to assess . Neurologic: no seizure no involuntary movements         Lab Data:   Basic Metabolic Panel: Recent Labs  Lab 01/29/20 1057 01/31/20 0627 02/03/20 0830  NA 139 140 139  K 4.9 4.1 3.5  CL 103 104 99  CO2 28 25 28   GLUCOSE 165* 140* 92  BUN 40* 35* 26*  CREATININE 0.49* 0.53* 0.43*  CALCIUM 8.1* 8.3* 8.4*  MG 2.0 1.9 1.5*  PHOS  --  3.6 4.2    ABG: No results for input(s): PHART, PCO2ART, PO2ART, HCO3, O2SAT in the last 168 hours.  Liver Function Tests: Recent Labs  Lab 01/31/20 0627  ALBUMIN 1.7*   No results for input(s): LIPASE, AMYLASE in the last 168 hours. No results for input(s): AMMONIA in the last 168 hours.  CBC: Recent Labs  Lab 01/29/20 1057 01/31/20 0627 02/03/20 0830  WBC 6.6 7.8 6.6  HGB 8.1* 8.3* 8.2*  HCT 26.7* 27.2* 26.8*  MCV 94.0 92.2 91.2  PLT 343 371 366    Cardiac Enzymes: Recent Labs  Lab 01/29/20 1057  CKTOTAL 70    BNP (last 3 results) No results for input(s): BNP in the last 8760  hours.  ProBNP (last 3 results) No results for input(s): PROBNP in the last 8760 hours.  Radiological Exams: No results found.  Assessment/Plan Active Problems:   Acute on chronic respiratory failure with hypoxia (HCC)   C3 spinal cord injury, sequela (HCC)   Aspiration pneumonia due to gastric secretions (HCC)   Increased tracheal secretions   Tracheostomy status (HCC)   1. Acute on chronic respiratory failure hypoxia we will continue with T collar trials titrate oxygen continue pulmonary toilet.  Patient goal is 20 hours 2. C3 spinal cord injury no change continue present management 3. Aspiration pneumonia treated we will continue to follow 4. Retained tracheal secretions at baseline continue present management 5. Tracheostomy remains in place    I have personally seen and evaluated the patient, evaluated laboratory and imaging results, formulated the assessment and plan and placed orders. The Patient requires high complexity decision making with multiple systems involvement.  Rounds were done with the Respiratory Therapy Director and Staff therapists and discussed with nursing staff also.  03/30/20, MD [facility] Pulmonary Critical Care Medicine Sleep Medicine

## 2020-02-05 DIAGNOSIS — J690 Pneumonitis due to inhalation of food and vomit: Secondary | ICD-10-CM | POA: Diagnosis not present

## 2020-02-05 DIAGNOSIS — J398 Other specified diseases of upper respiratory tract: Secondary | ICD-10-CM | POA: Diagnosis not present

## 2020-02-05 DIAGNOSIS — J9621 Acute and chronic respiratory failure with hypoxia: Secondary | ICD-10-CM | POA: Diagnosis not present

## 2020-02-05 DIAGNOSIS — S14103S Unspecified injury at C3 level of cervical spinal cord, sequela: Secondary | ICD-10-CM | POA: Diagnosis not present

## 2020-02-05 LAB — CBC
HCT: 27.4 % — ABNORMAL LOW (ref 39.0–52.0)
Hemoglobin: 8.4 g/dL — ABNORMAL LOW (ref 13.0–17.0)
MCH: 28.1 pg (ref 26.0–34.0)
MCHC: 30.7 g/dL (ref 30.0–36.0)
MCV: 91.6 fL (ref 80.0–100.0)
Platelets: 354 10*3/uL (ref 150–400)
RBC: 2.99 MIL/uL — ABNORMAL LOW (ref 4.22–5.81)
RDW: 16 % — ABNORMAL HIGH (ref 11.5–15.5)
WBC: 7.4 10*3/uL (ref 4.0–10.5)
nRBC: 0 % (ref 0.0–0.2)

## 2020-02-05 LAB — BASIC METABOLIC PANEL
Anion gap: 11 (ref 5–15)
BUN: 24 mg/dL — ABNORMAL HIGH (ref 8–23)
CO2: 29 mmol/L (ref 22–32)
Calcium: 8 mg/dL — ABNORMAL LOW (ref 8.9–10.3)
Chloride: 99 mmol/L (ref 98–111)
Creatinine, Ser: 0.46 mg/dL — ABNORMAL LOW (ref 0.61–1.24)
GFR calc Af Amer: >60 mL/min (ref >60)
GFR calc non Af Amer: >60 mL/min (ref >60)
Glucose, Bld: 127 mg/dL — ABNORMAL HIGH (ref 70–99)
Potassium: 3.7 mmol/L (ref 3.5–5.1)
Sodium: 139 mmol/L (ref 135–145)

## 2020-02-05 LAB — PHOSPHORUS: Phosphorus: 3.2 mg/dL (ref 2.5–4.6)

## 2020-02-05 LAB — MAGNESIUM: Magnesium: 1.8 mg/dL (ref 1.7–2.4)

## 2020-02-05 NOTE — Progress Notes (Addendum)
Pulmonary Critical Care Medicine [facility] [place]   PULMONARY CRITICAL CARE SERVICE  PROGRESS NOTE  Date of Service: 02/05/2020  Anthony Andersen  [id number]:[id number]  DOB: Sep 21, 1941   DOA: 01/14/2020  Referring Physician: Carron Curie, MD  HPI: Anthony Andersen is a 79 y.o. male seen for follow up of Acute on Chronic Respiratory Failure. Pt continues to wean on ATC, satting well. No acute distress is noted.   Medications: Reviewed on Rounds  Physical Exam:  Vitals: pulse 90, resp 18, bp 112/63, o2 sat 97%, temp 98.0  Ventilator Settings ATC 305  . General: Comfortable at this time . Eyes: Grossly normal lids, irises & conjunctiva . ENT: grossly tongue is normal . Neck: no obvious mass . Cardiovascular: S1 S2 normal no gallop . Respiratory: no rales or ronchi noted . Abdomen: soft . Skin: no rash seen on limited exam . Musculoskeletal: not rigid . Psychiatric:unable to assess . Neurologic: no seizure no involuntary movements         Lab Data:   Basic Metabolic Panel: Recent Labs  Lab 01/31/20 0627 02/03/20 0830 02/05/20 0324  NA 140 139 139  K 4.1 3.5 3.7  CL 104 99 99  CO2 25 28 29   GLUCOSE 140* 92 127*  BUN 35* 26* 24*  CREATININE 0.53* 0.43* 0.46*  CALCIUM 8.3* 8.4* 8.0*  MG 1.9 1.5* 1.8  PHOS 3.6 4.2 3.2    ABG: No results for input(s): PHART, PCO2ART, PO2ART, HCO3, O2SAT in the last 168 hours.  Liver Function Tests: Recent Labs  Lab 01/31/20 0627  ALBUMIN 1.7*   No results for input(s): LIPASE, AMYLASE in the last 168 hours. No results for input(s): AMMONIA in the last 168 hours.  CBC: Recent Labs  Lab 01/31/20 0627 02/03/20 0830 02/05/20 0324  WBC 7.8 6.6 7.4  HGB 8.3* 8.2* 8.4*  HCT 27.2* 26.8* 27.4*  MCV 92.2 91.2 91.6  PLT 371 366 354    Cardiac Enzymes: No results for input(s): CKTOTAL, CKMB, CKMBINDEX, TROPONINI in the last 168 hours.  BNP (last 3 results) No results for input(s): BNP in the last 8760 hours.  ProBNP  (last 3 results) No results for input(s): PROBNP in the last 8760 hours.  Radiological Exams: No results found.  Assessment/Plan Active Problems:   Acute on chronic respiratory failure with hypoxia (HCC)   C3 spinal cord injury, sequela (HCC)   Aspiration pneumonia due to gastric secretions (HCC)   Increased tracheal secretions   Tracheostomy status (HCC)   1. Acute on chronic respiratory failure hypoxia we will continue with T collar trials titrate oxygen continue pulmonary toilet.  Patient goal is 20 hours 2. C3 spinal cord injury no change continue present management 3. Aspiration pneumonia treated we will continue to follow 4. Retained tracheal secretions at baseline continue present management 5. Tracheostomy remains in place   I have personally seen and evaluated the patient, evaluated laboratory and imaging results, formulated the assessment and plan and placed orders. The Patient requires high complexity decision making with multiple systems involvement.  Rounds were done with the Respiratory Therapy Director and Staff therapists and discussed with nursing staff also.  01/24/2020, MD [facility] Pulmonary Critical Care Medicine Sleep Medicine

## 2020-02-06 ENCOUNTER — Other Ambulatory Visit (HOSPITAL_COMMUNITY): Payer: Medicare Other

## 2020-02-06 LAB — COMPREHENSIVE METABOLIC PANEL
ALT: 106 U/L — ABNORMAL HIGH (ref 0–44)
AST: 73 U/L — ABNORMAL HIGH (ref 15–41)
Albumin: 1.5 g/dL — ABNORMAL LOW (ref 3.5–5.0)
Alkaline Phosphatase: 472 U/L — ABNORMAL HIGH (ref 38–126)
Anion gap: 8 (ref 5–15)
BUN: 27 mg/dL — ABNORMAL HIGH (ref 8–23)
CO2: 31 mmol/L (ref 22–32)
Calcium: 8.2 mg/dL — ABNORMAL LOW (ref 8.9–10.3)
Chloride: 100 mmol/L (ref 98–111)
Creatinine, Ser: 0.45 mg/dL — ABNORMAL LOW (ref 0.61–1.24)
GFR calc Af Amer: >60 mL/min (ref >60)
GFR calc non Af Amer: >60 mL/min (ref >60)
Glucose, Bld: 106 mg/dL — ABNORMAL HIGH (ref 70–99)
Potassium: 4.2 mmol/L (ref 3.5–5.1)
Sodium: 139 mmol/L (ref 135–145)
Total Bilirubin: 6 mg/dL — ABNORMAL HIGH (ref 0.3–1.2)
Total Protein: 5.8 g/dL — ABNORMAL LOW (ref 6.5–8.1)

## 2020-02-06 LAB — MAGNESIUM: Magnesium: 2 mg/dL (ref 1.7–2.4)

## 2020-02-06 NOTE — Progress Notes (Signed)
Pt with penile implant. Tried to clear pt with Radiologist Dr. Elvera Maria since wife said she did not have implant card. Per Dr. Elvera Maria, implant information is required in order to proceed with scan. Per wife pt got implant about 15 years ago in [place] with Urologist. RN aware that we need information and wife or doctor may be able to gather info from urologist. Unable to clear without more details.

## 2020-02-07 ENCOUNTER — Emergency Department (HOSPITAL_COMMUNITY)
Admission: EM | Admit: 2020-02-07 | Discharge: 2020-02-07 | Disposition: A | Discharge status: Home / Self Care | Payer: Medicare Other | Attending: Emergency Medicine | Admitting: Emergency Medicine

## 2020-02-07 ENCOUNTER — Other Ambulatory Visit: Payer: Self-pay | Admitting: Physician Assistant

## 2020-02-07 ENCOUNTER — Other Ambulatory Visit: Payer: Self-pay

## 2020-02-07 ENCOUNTER — Inpatient Hospital Stay
Admission: AD | Admit: 2020-02-07 | Discharge: 2020-03-23 | Disposition: E | Payer: Medicare Other | Source: Other Acute Inpatient Hospital | Attending: Internal Medicine | Admitting: Internal Medicine

## 2020-02-07 DIAGNOSIS — R17 Unspecified jaundice: Secondary | ICD-10-CM

## 2020-02-07 DIAGNOSIS — S14103S Unspecified injury at C3 level of cervical spinal cord, sequela: Secondary | ICD-10-CM

## 2020-02-07 DIAGNOSIS — J9621 Acute and chronic respiratory failure with hypoxia: Secondary | ICD-10-CM | POA: Diagnosis present

## 2020-02-07 DIAGNOSIS — J690 Pneumonitis due to inhalation of food and vomit: Secondary | ICD-10-CM | POA: Diagnosis present

## 2020-02-07 DIAGNOSIS — R945 Abnormal results of liver function studies: Secondary | ICD-10-CM | POA: Diagnosis not present

## 2020-02-07 DIAGNOSIS — Z930 Tracheostomy status: Secondary | ICD-10-CM | POA: Insufficient documentation

## 2020-02-07 DIAGNOSIS — Z79899 Other long term (current) drug therapy: Secondary | ICD-10-CM | POA: Insufficient documentation

## 2020-02-07 DIAGNOSIS — J398 Other specified diseases of upper respiratory tract: Secondary | ICD-10-CM | POA: Diagnosis not present

## 2020-02-07 DIAGNOSIS — I10 Essential (primary) hypertension: Secondary | ICD-10-CM | POA: Insufficient documentation

## 2020-02-07 DIAGNOSIS — R509 Fever, unspecified: Secondary | ICD-10-CM

## 2020-02-07 DIAGNOSIS — R748 Abnormal levels of other serum enzymes: Secondary | ICD-10-CM

## 2020-02-07 LAB — CBC
HCT: 27.5 % — ABNORMAL LOW (ref 39.0–52.0)
Hemoglobin: 8.8 g/dL — ABNORMAL LOW (ref 13.0–17.0)
MCH: 28.8 pg (ref 26.0–34.0)
MCHC: 32 g/dL (ref 30.0–36.0)
MCV: 89.9 fL (ref 80.0–100.0)
Platelets: 366 10*3/uL (ref 150–400)
RBC: 3.06 MIL/uL — ABNORMAL LOW (ref 4.22–5.81)
RDW: 16.3 % — ABNORMAL HIGH (ref 11.5–15.5)
WBC: 6.2 10*3/uL (ref 4.0–10.5)
nRBC: 0 % (ref 0.0–0.2)

## 2020-02-07 LAB — BASIC METABOLIC PANEL
Anion gap: 7 (ref 5–15)
BUN: 29 mg/dL — ABNORMAL HIGH (ref 8–23)
CO2: 30 mmol/L (ref 22–32)
Calcium: 8 mg/dL — ABNORMAL LOW (ref 8.9–10.3)
Chloride: 101 mmol/L (ref 98–111)
Creatinine, Ser: 0.44 mg/dL — ABNORMAL LOW (ref 0.61–1.24)
GFR calc Af Amer: >60 mL/min (ref >60)
GFR calc non Af Amer: >60 mL/min (ref >60)
Glucose, Bld: 102 mg/dL — ABNORMAL HIGH (ref 70–99)
Potassium: 3.7 mmol/L (ref 3.5–5.1)
Sodium: 138 mmol/L (ref 135–145)

## 2020-02-07 LAB — LIPASE, BLOOD: Lipase: 34 U/L (ref 11–51)

## 2020-02-07 LAB — HEPATIC FUNCTION PANEL
ALT: 102 U/L — ABNORMAL HIGH (ref 0–44)
AST: 67 U/L — ABNORMAL HIGH (ref 15–41)
Albumin: 1.7 g/dL — ABNORMAL LOW (ref 3.5–5.0)
Alkaline Phosphatase: 500 U/L — ABNORMAL HIGH (ref 38–126)
Bilirubin, Direct: 4.2 mg/dL — ABNORMAL HIGH (ref 0.0–0.2)
Indirect Bilirubin: 2 mg/dL — ABNORMAL HIGH (ref 0.3–0.9)
Total Bilirubin: 6.2 mg/dL — ABNORMAL HIGH (ref 0.3–1.2)
Total Protein: 6.2 g/dL — ABNORMAL LOW (ref 6.5–8.1)

## 2020-02-07 LAB — MAGNESIUM: Magnesium: 1.8 mg/dL (ref 1.7–2.4)

## 2020-02-07 LAB — PHOSPHORUS: Phosphorus: 4 mg/dL (ref 2.5–4.6)

## 2020-02-07 NOTE — Progress Notes (Signed)
Pulmonary Critical Care Medicine [place]   PULMONARY CRITICAL CARE SERVICE  PROGRESS NOTE  Date of Service: 02/17/2020  AADEN BUCKMAN  HYI:[id number]  DOB: [birth year]   DOA: 01/14/2020  Referring Physician: Merton Border, MD  HPI: AARNAV STEAGALL is a 79 y.o. male seen for follow up of Acute on Chronic Respiratory Failure.  Patient currently is on T collar has been on 28% FiO2 today's goal is 48 hours.  From a weaning perspective he has been doing fine however he has had bump in his liver functions with increase in total bilirubin as well as increase in the alk phos.  Patient is going to be evaluated by surgery for possibility of biliary obstruction  Medications: Reviewed on Rounds  Physical Exam:  Vitals: Temperature is 98.5 pulse 97 respiratory 22 blood pressure is 115/68 saturations 99%  Ventilator Settings on T collar with an FiO2 of 28%  . General: Comfortable at this time . Eyes: Grossly normal lids, irises & conjunctiva . ENT: grossly tongue is normal . Neck: no obvious mass . Cardiovascular: S1 S2 normal no gallop . Respiratory: No rhonchi no rales are noted at this time . Abdomen: soft . Skin: no rash seen on limited exam . Musculoskeletal: not rigid . Psychiatric:unable to assess . Neurologic: no seizure no involuntary movements         Lab Data:   Basic Metabolic Panel: Recent Labs  Lab 02/03/20 0830 02/05/20 0324 02/06/20 0444 02/16/2020 0458  NA 139 139 139 138  K 3.5 3.7 4.2 3.7  CL 99 99 100 101  CO2 _0 GLUCOSE 92 127* 106* 102*  BUN 26* 24* 27* 29*  CREATININE 0.43* 0.46* 0.45* 0.44*  CALCIUM 8.4* 8.0* 8.2* 8.0*  MG 1.5* 1.8 2.0 1.8  PHOS 4.2 3.2  --  4.0    ABG: No results for input(s): PHART, PCO2ART, PO2ART, HCO3, O2SAT in the last 168 hours.  Liver Function Tests: Recent Labs  Lab 02/06/20 0444  AST 73*  ALT 106*  ALKPHOS 472*  BILITOT 6.0*  PROT 5.8*  ALBUMIN 1.5*   No results for input(s): LIPASE,  AMYLASE in the last 168 hours. No results for input(s): AMMONIA in the last 168 hours.  CBC: Recent Labs  Lab 02/03/20 0830 02/05/20 0324 01/30/2020 0458  WBC 6.6 7.4 6.2  HGB 8.2* 8.4* 8.8*  HCT 26.8* 27.4* 27.5*  MCV 91.2 91.6 89.9  PLT 366 354 366    Cardiac Enzymes: No results for input(s): CKTOTAL, CKMB, CKMBINDEX, TROPONINI in the last 168 hours.  BNP (last 3 results) No results for input(s): BNP in the last 8760 hours.  ProBNP (last 3 results) No results for input(s): PROBNP in the last 8760 hours.  Radiological Exams: US Abdomen Limited RUQ  Result Date: 02/06/2020 CLINICAL DATA:  Cholelithiasis EXAM: ULTRASOUND ABDOMEN LIMITED RIGHT UPPER QUADRANT COMPARISON:  01/29/2020 FINDINGS: Gallbladder: Gallbladder is well distended with cholelithiasis particularly within the neck of the gallbladder. No wall thickening or pericholecystic fluid is noted. Common bile duct: Diameter: 5 mm Liver: No focal lesion identified. Within normal limits in parenchymal echogenicity. Portal vein is patent on color Doppler imaging with normal direction of blood flow towards the liver. Other: None. IMPRESSION: Cholelithiasis without complicating factors. Electronically Signed   By: Inez Catalina M.D.   On: 02/06/2020 18:55    Assessment/Plan Active Problems:   Acute on chronic respiratory failure with hypoxia (HCC)   C3 spinal cord injury, sequela ([place])  Aspiration pneumonia due to gastric secretions (HCC)   Increased tracheal secretions   Tracheostomy status ([place])   1. Acute on chronic respiratory failure with hypoxia patient is doing fine with the weaning however has to be evaluated for his biliary obstruction patient was noted to have gallstones on the ultrasound 2. C3 spinal cord injury no change we will continue with supportive care 3. Aspiration pneumonia treated clinically is improved 4. Retained tracheal secretions improving 5. Tracheostomy will remain in place for now's anticipated  surgical evaluation   I have personally seen and evaluated the patient, evaluated laboratory and imaging results, formulated the assessment and plan and placed orders. The Patient requires high complexity decision making with multiple systems involvement.  Rounds were done with the Respiratory Therapy Director and Staff therapists and discussed with nursing staff also.  Allyne Gee, MD [facility] Pulmonary Critical Care Medicine Sleep Medicine

## 2020-02-07 NOTE — ED Provider Notes (Signed)
[facility] EMERGENCY DEPARTMENT Provider Note   CSN: [id number] Arrival date & time: 02-18-2020  1129  LEVEL 5 CAVEAT - TRACHEOSTOMY History Chief Complaint  Patient presents with  . Jaundice    Anthony Andersen is a 79 y.o. male.  HPI 79 year old male presents as transfer from [facility] for abnormal LFTs.  Had liver function test obtained yesterday.  History is mostly from chart review as well as the wife at the bedside.  Has cervical spinal cord injury requiring tracheostomy.  [facility] at [facility] about a [facility].  Has had jaundice that the wife has noticed for about a week and LFTs yesterday showed a bilirubin of 6.  Had an abdominal ultrasound that showed gallstones.  Sent here this morning.   Past Medical History:  Diagnosis Date  . Acute on chronic respiratory failure with hypoxia (HCC)   . Anxiety    "at times"  . Arthritis   . Aspiration pneumonia due to gastric secretions (HCC)   . C3 spinal cord injury, sequela (HCC)   . Headache   . High cholesterol   . History of bronchitis as a child   . Hypertension   . Increased tracheal secretions   . Nocturia   . Tracheostomy status (HCC)   . Urinary frequency   . Wears glasses     Patient Active Problem List   Diagnosis Date Noted  . Acute on chronic respiratory failure with hypoxia (HCC)   . C3 spinal cord injury, sequela (HCC)   . Aspiration pneumonia due to gastric secretions (HCC)   . Increased tracheal secretions   . Tracheostomy status (HCC)   . 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 Paresthesias 06/23/2015  . Balance disorder 06/23/2015  . Weakness 06/23/2015    Past Surgical History:  Procedure Laterality Date  . BACK SURGERY     x2  . COLONOSCOPY    . ESOPHAGOGASTRODUODENOSCOPY    . EYE SURGERY Bilateral    cataracts  . HEMORRHOID SURGERY    . IR GASTROSTOMY TUBE MOD SED  02/02/2020  . KNEE ARTHROSCOPY Left   .  LUMBAR LAMINECTOMY/DECOMPRESSION MICRODISCECTOMY N/A 09/06/2015   Procedure: Thorasic nine-eleven DECOMPRESSION;  Surgeon: Venita Lick, MD;  Location: [facility] OR;  Service: Orthopedics;  Laterality: N/A;       Family History  Problem Relation Age of Onset  . Diabetes Mellitus I Unknown   . Hypertension Unknown   . Cancer Mother   . Cancer Father   . Cancer Maternal Grandmother   . Cancer Paternal Grandfather     Social History   Tobacco Use  . Smoking status: Never Smoker  Substance Use Topics  . Alcohol use: Yes    Alcohol/week: 0.0 standard drinks    Comment: Occasional   . Drug use: No    Home Medications Prior to Admission medications   Medication Sig Start Date End Date Taking? Authorizing Provider  gabapentin (NEURONTIN) 300 MG capsule Take 300 mg by mouth 3 (three) times daily. 06/16/15   [provider]  methocarbamol (ROBAXIN) 500 MG tablet Take 1 tablet (500 mg total) by mouth 3 (three) times daily as needed for muscle spasms. 09/06/15   Venita Lick, MD  Multiple Vitamins-Minerals (MULTIVITAMIN ADULT PO) Take 1 tablet by mouth daily.    [provider]  ondansetron (ZOFRAN) 4 MG tablet Take 1 tablet (4 mg total) by mouth every 8 (eight) hours as needed for nausea  or vomiting. 09/06/15   Melina Schools, MD  oxyCODONE-acetaminophen (PERCOCET) 10-325 MG tablet Take 1 tablet by mouth every 4 (four) hours as needed for pain. 09/06/15   Melina Schools, MD  ZETIA 10 MG tablet Take 10 mg by mouth daily. 05/18/15   [provider]    Allergies    Patient has no known allergies.  Review of Systems   Review of Systems  Unable to perform ROS: Patient nonverbal    Physical Exam Updated Vital Signs BP (!) 143/77   Pulse (!) 103   Temp 99.3 F (37.4 C) (Rectal)   Resp (!) 21   SpO2 99%   Physical Exam Vitals and nursing note reviewed.  Constitutional:      Appearance: Anthony Andersen is well-developed.  HENT:     Head: Normocephalic and atraumatic.       Right Ear: External ear normal.     Left Ear: External ear normal.     Nose: Nose normal.  Eyes:     General:        Right eye: No discharge.        Left eye: No discharge.  Cardiovascular:     Rate and Rhythm: Normal rate and regular rhythm.     Heart sounds: Normal heart sounds.  Pulmonary:     Effort: Pulmonary effort is normal.  Abdominal:     General: There is no distension.     Palpations: Abdomen is soft.     Tenderness: There is no abdominal tenderness.  Musculoskeletal:     Cervical back: Neck supple.  Skin:    General: Skin is warm and dry.     Coloration: Skin is jaundiced.  Neurological:     Mental Status: Anthony Andersen is alert.  Psychiatric:        Mood and Affect: Mood is not anxious.     ED Results / Procedures / Treatments   Labs (all labs ordered are listed, but only abnormal results are displayed) Labs Reviewed  HEPATIC FUNCTION PANEL - Abnormal; Notable for the following components:      Result Value   Total Protein 6.2 (*)    Albumin 1.7 (*)    AST 67 (*)    ALT 102 (*)    Alkaline Phosphatase 500 (*)    Total Bilirubin 6.2 (*)    Bilirubin, Direct 4.2 (*)    Indirect Bilirubin 2.0 (*)    All other components within normal limits  LIPASE, BLOOD    EKG None  Radiology US Abdomen Limited RUQ  Result Date: 02/06/2020 CLINICAL DATA:  Cholelithiasis EXAM: ULTRASOUND ABDOMEN LIMITED RIGHT UPPER QUADRANT COMPARISON:  01/29/2020 FINDINGS: Gallbladder: Gallbladder is well distended with cholelithiasis particularly within the neck of the gallbladder. No wall thickening or pericholecystic fluid is noted. Common bile duct: Diameter: 5 mm Liver: No focal lesion identified. Within normal limits in parenchymal echogenicity. Portal vein is patent on color Doppler imaging with normal direction of blood flow towards the liver. Other: None. IMPRESSION: Cholelithiasis without complicating factors. Electronically Signed   By: Inez Catalina M.D.   On: 02/06/2020 18:55     Procedures Procedures (including critical care time)  Medications Ordered in ED Medications - No data to display  ED Course  I have reviewed the triage vital signs and the nursing notes.  Pertinent labs & imaging results that were available during my care of the patient were reviewed by me and considered in my medical decision making (see chart for details).  MDM Rules/Calculators/A&P                      I reviewed the labs from this morning. Has stable hemoglobin and normal WBC.  LFTs and lipase were ordered. LFTs are stable when compared to yesterday. I discussed with general surgery, given cholelithiasis but no signs of cholecystitis, no need for surgical consult.  I discussed with Merilyn Baba of gastroenterology.  She notes that given stable bilirubin, and no CBD dilation on ultrasound, patient can have a medical work-up by the doctors at [facility].  Have called MRI about whether or not Anthony Andersen can receive an MRI but this is still pending as they are still working on trying to figure out if his device is MRI capable.  Thus I discussed with wife and we will discharge [facility] to [facility] and they can continue the work-up from a medical and MR standpoint.  Obviously return if any symptoms change or worsen.  Temp is 99 rectally but no fever to be concerned about infectious process at this time. Final Clinical Impression(s) / ED Diagnoses Final diagnoses:  Hyperbilirubinemia    Rx / DC Orders ED Discharge Orders    None       Pricilla Loveless, MD 2020-02-11 1506

## 2020-02-07 NOTE — ED Triage Notes (Signed)
Pt arrived to ED via [facility] RN & RT, wife at bedside. Pt has a trach with hood O2 in place, pt is alert, came in today d/t noted jaundice for the past week & increased LFT during blood draw.

## 2020-02-07 NOTE — Discharge Instructions (Addendum)
Gastroenterology is recommending medical work-up and MRCP.  If these reveal any obvious emergent causes of his hyperbilirubinemia then consult gastroenterology or send back to the emergency department.

## 2020-02-08 ENCOUNTER — Other Ambulatory Visit (HOSPITAL_COMMUNITY): Payer: Self-pay

## 2020-02-08 DIAGNOSIS — J690 Pneumonitis due to inhalation of food and vomit: Secondary | ICD-10-CM

## 2020-02-08 DIAGNOSIS — J398 Other specified diseases of upper respiratory tract: Secondary | ICD-10-CM | POA: Diagnosis not present

## 2020-02-08 DIAGNOSIS — J9621 Acute and chronic respiratory failure with hypoxia: Secondary | ICD-10-CM | POA: Diagnosis not present

## 2020-02-08 DIAGNOSIS — Z930 Tracheostomy status: Secondary | ICD-10-CM

## 2020-02-08 DIAGNOSIS — S14103S Unspecified injury at C3 level of cervical spinal cord, sequela: Secondary | ICD-10-CM

## 2020-02-08 NOTE — Progress Notes (Addendum)
Pulmonary Critical Care Medicine [facility] [place]   PULMONARY CRITICAL CARE SERVICE  PROGRESS NOTE  Date of Service: 02/08/2020  Anthony Andersen  LFY:[id number]  DOB: 11-Feb-1941   DOA: 02/15/2020  Referring Physician: Carron Curie, MD  HPI: Anthony Andersen is a 79 y.o. male seen for follow up of Acute on Chronic Respiratory Failure.  Patient is on T collar today has been on 28% FiO2.  Was seen in the ED for evaluation of gallstones.  They feel right now the patient does not need surgical intervention.  Patient does have significant jaundice which is not changed.  Right now he is going to be completing T collar for 48 hours  Medications: Reviewed on Rounds  Physical Exam:  Vitals: Temperature is 97.5 pulse 94 respiratory 27 blood pressure is 120/70 saturations 100%  Ventilator Settings off the ventilator on T collar currently on 28% FiO2  . General: Comfortable at this time . Eyes: Grossly normal lids, irises & conjunctiva . ENT: grossly tongue is normal . Neck: no obvious mass . Cardiovascular: S1 S2 normal no gallop . Respiratory: No rhonchi no rales are noted at this time . Abdomen: soft . Skin: no rash seen on limited exam . Musculoskeletal: not rigid . Psychiatric:unable to assess . Neurologic: no seizure no involuntary movements         Lab Data:   Basic Metabolic Panel: Recent Labs  Lab 02/03/20 0830 02/05/20 0324 02/06/20 0444 01/24/2020 0458  NA 139 139 139 138  K 3.5 3.7 4.2 3.7  CL 99 99 100 101  CO2 28 29 31 30   GLUCOSE 92 127* 106* 102*  BUN 26* 24* 27* 29*  CREATININE 0.43* 0.46* 0.45* 0.44*  CALCIUM 8.4* 8.0* 8.2* 8.0*  MG 1.5* 1.8 2.0 1.8  PHOS 4.2 3.2  --  4.0    ABG: No results for input(s): PHART, PCO2ART, PO2ART, HCO3, O2SAT in the last 168 hours.  Liver Function Tests: Recent Labs  Lab 02/06/20 0444 02/01/2020 1212  AST 73* 67*  ALT 106* 102*  ALKPHOS 472* 500*  BILITOT 6.0* 6.2*  PROT 5.8* 6.2*  ALBUMIN 1.5* 1.7*    Recent Labs  Lab 02/04/2020 1212  LIPASE 34   No results for input(s): AMMONIA in the last 168 hours.  CBC: Recent Labs  Lab 02/03/20 0830 02/05/20 0324 02/18/2020 0458  WBC 6.6 7.4 6.2  HGB 8.2* 8.4* 8.8*  HCT 26.8* 27.4* 27.5*  MCV 91.2 91.6 89.9  PLT 366 354 366    Cardiac Enzymes: No results for input(s): CKTOTAL, CKMB, CKMBINDEX, TROPONINI in the last 168 hours.  BNP (last 3 results) No results for input(s): BNP in the last 8760 hours.  ProBNP (last 3 results) No results for input(s): PROBNP in the last 8760 hours.  Radiological Exams: 02/09/20 Abdomen Limited RUQ  Result Date: 02/06/2020 CLINICAL DATA:  Cholelithiasis EXAM: ULTRASOUND ABDOMEN LIMITED RIGHT UPPER QUADRANT COMPARISON:  01/29/2020 FINDINGS: Gallbladder: Gallbladder is well distended with cholelithiasis particularly within the neck of the gallbladder. No wall thickening or pericholecystic fluid is noted. Common bile duct: Diameter: 5 mm Liver: No focal lesion identified. Within normal limits in parenchymal echogenicity. Portal vein is patent on color Doppler imaging with normal direction of blood flow towards the liver. Other: None. IMPRESSION: Cholelithiasis without complicating factors. Electronically Signed   By: 03/30/2020 M.D.   On: 02/06/2020 18:55    Assessment/Plan Active Problems:   Acute on chronic respiratory failure with hypoxia (HCC)   C3 spinal  cord injury, sequela ([place])   Aspiration pneumonia due to gastric secretions ([place])   Increased tracheal secretions   Tracheostomy status ([place])   1. Acute on chronic respiratory failure hypoxia we will continue with T collar trials titrate oxygen continue pulmonary toilet. 2. C3 spinal cord injury no change 3. Aspiration pneumonia treated clinically. 4. Retained secretions continue aggressive pulmonary toilet 5. Tracheostomy will be changed out today to a cuffless trach   I have personally seen and evaluated the patient, evaluated laboratory and  imaging results, formulated the assessment and plan and placed orders. The Patient requires high complexity decision making with multiple systems involvement.  Rounds were done with the Respiratory Therapy Director and Staff therapists and discussed with nursing staff also.  Allyne Gee, MD [facility] Pulmonary Critical Care Medicine Sleep Medicine

## 2020-02-09 ENCOUNTER — Other Ambulatory Visit (HOSPITAL_COMMUNITY): Payer: Self-pay

## 2020-02-09 DIAGNOSIS — S14103S Unspecified injury at C3 level of cervical spinal cord, sequela: Secondary | ICD-10-CM | POA: Diagnosis not present

## 2020-02-09 DIAGNOSIS — J9621 Acute and chronic respiratory failure with hypoxia: Secondary | ICD-10-CM | POA: Diagnosis not present

## 2020-02-09 DIAGNOSIS — J690 Pneumonitis due to inhalation of food and vomit: Secondary | ICD-10-CM | POA: Diagnosis not present

## 2020-02-09 DIAGNOSIS — J398 Other specified diseases of upper respiratory tract: Secondary | ICD-10-CM | POA: Diagnosis not present

## 2020-02-09 LAB — CBC
HCT: 27.5 % — ABNORMAL LOW (ref 39.0–52.0)
Hemoglobin: 8.6 g/dL — ABNORMAL LOW (ref 13.0–17.0)
MCH: 28.5 pg (ref 26.0–34.0)
MCHC: 31.3 g/dL (ref 30.0–36.0)
MCV: 91.1 fL (ref 80.0–100.0)
Platelets: 330 10*3/uL (ref 150–400)
RBC: 3.02 MIL/uL — ABNORMAL LOW (ref 4.22–5.81)
RDW: 16.9 % — ABNORMAL HIGH (ref 11.5–15.5)
WBC: 5.5 10*3/uL (ref 4.0–10.5)
nRBC: 0 % (ref 0.0–0.2)

## 2020-02-09 LAB — HEPATIC FUNCTION PANEL
ALT: 77 U/L — ABNORMAL HIGH (ref 0–44)
AST: 60 U/L — ABNORMAL HIGH (ref 15–41)
Albumin: 1.6 g/dL — ABNORMAL LOW (ref 3.5–5.0)
Alkaline Phosphatase: 457 U/L — ABNORMAL HIGH (ref 38–126)
Bilirubin, Direct: 4.6 mg/dL — ABNORMAL HIGH (ref 0.0–0.2)
Indirect Bilirubin: 2.3 mg/dL — ABNORMAL HIGH (ref 0.3–0.9)
Total Bilirubin: 6.9 mg/dL — ABNORMAL HIGH (ref 0.3–1.2)
Total Protein: 5.8 g/dL — ABNORMAL LOW (ref 6.5–8.1)

## 2020-02-09 LAB — BASIC METABOLIC PANEL
Anion gap: 8 (ref 5–15)
BUN: 25 mg/dL — ABNORMAL HIGH (ref 8–23)
CO2: 30 mmol/L (ref 22–32)
Calcium: 8.2 mg/dL — ABNORMAL LOW (ref 8.9–10.3)
Chloride: 102 mmol/L (ref 98–111)
Creatinine, Ser: 0.56 mg/dL — ABNORMAL LOW (ref 0.61–1.24)
GFR calc Af Amer: >60 mL/min (ref >60)
GFR calc non Af Amer: >60 mL/min (ref >60)
Glucose, Bld: 75 mg/dL (ref 70–99)
Potassium: 3.8 mmol/L (ref 3.5–5.1)
Sodium: 140 mmol/L (ref 135–145)

## 2020-02-09 LAB — PROTIME-INR
INR: 1.2 (ref 0.8–1.2)
Prothrombin Time: 14.6 seconds (ref 11.4–15.2)

## 2020-02-09 MED ORDER — GADOBUTROL 1 MMOL/ML IV SOLN
10.0000 mL | Freq: Once | INTRAVENOUS | Status: AC | PRN
  Administered 2020-02-09: 10 mL via INTRAVENOUS

## 2020-02-09 NOTE — Progress Notes (Addendum)
Pulmonary Critical Care Medicine [place]   PULMONARY CRITICAL CARE SERVICE  PROGRESS NOTE  Date of Service: 02/09/2020  Anthony Andersen  [id number]:[id number]  DOB: 1941/01/08   DOA: 02/05/2020  Referring Physician: Merton Border, MD  HPI: Anthony Andersen is a 79 y.o. male seen for follow up of Acute on Chronic Respiratory Failure.  Patient remains on aerosol trach collar 20% FiO2 satting well no fever distress.  Medications: Reviewed on Rounds  Physical Exam:  Vitals: Pulse 88 respirations 33 BP 129/68 O2 sat 99% temp 98.4  Ventilator Settings ATC 28%  . General: Comfortable at this time . Eyes: Grossly normal lids, irises & conjunctiva . ENT: grossly tongue is normal . Neck: no obvious mass . Cardiovascular: S1 S2 normal no gallop . Respiratory: No rales or rhonchi noted . Abdomen: soft . Skin: no rash seen on limited exam . Musculoskeletal: not rigid . Psychiatric:unable to assess . Neurologic: no seizure no involuntary movements         Lab Data:   Basic Metabolic Panel: Recent Labs  Lab 02/03/20 0830 02/05/20 0324 02/06/20 0444 01/26/2020 0458 02/09/20 0720  NA 139 139 139 138 140  K 3.5 3.7 4.2 3.7 3.8  CL 99 99 100 101 102  CO2 28 29 31 30 30   GLUCOSE 92 127* 106* 102* 75  BUN 26* 24* 27* 29* 25*  CREATININE 0.43* 0.46* 0.45* 0.44* 0.56*  CALCIUM 8.4* 8.0* 8.2* 8.0* 8.2*  MG 1.5* 1.8 2.0 1.8  --   PHOS 4.2 3.2  --  4.0  --     ABG: No results for input(s): PHART, PCO2ART, PO2ART, HCO3, O2SAT in the last 168 hours.  Liver Function Tests: Recent Labs  Lab 02/06/20 0444 02/11/2020 1212 02/09/20 0720  AST 73* 67* 60*  ALT 106* 102* 77*  ALKPHOS 472* 500* 457*  BILITOT 6.0* 6.2* 6.9*  PROT 5.8* 6.2* 5.8*  ALBUMIN 1.5* 1.7* 1.6*   Recent Labs  Lab 02/08/2020 1212  LIPASE 34   No results for input(s): AMMONIA in the last 168 hours.  CBC: Recent Labs  Lab 02/03/20 0830 02/05/20 0324 01/31/2020 0458 02/09/20 0720  WBC 6.6 7.4 6.2  5.5  HGB 8.2* 8.4* 8.8* 8.6*  HCT 26.8* 27.4* 27.5* 27.5*  MCV 91.2 91.6 89.9 91.1  PLT 366 354 366 330    Cardiac Enzymes: No results for input(s): CKTOTAL, CKMB, CKMBINDEX, TROPONINI in the last 168 hours.  BNP (last 3 results) No results for input(s): BNP in the last 8760 hours.  ProBNP (last 3 results) No results for input(s): PROBNP in the last 8760 hours.  Radiological Exams: MR ABDOMEN MRCP W WO CONTAST  Result Date: 02/09/2020 CLINICAL DATA:  Cholelithiasis, status post percutaneous gastrostomy EXAM: MRI ABDOMEN WITHOUT AND WITH CONTRAST (INCLUDING MRCP) TECHNIQUE: Multiplanar multisequence MR imaging of the abdomen was performed both before and after the administration of intravenous contrast. Heavily T2-weighted images of the biliary and pancreatic ducts were obtained, and three-dimensional MRCP images were rendered by post processing. Per technologist note, patient was unable to follow free breathing commands. CONTRAST:  10mL GADAVIST GADOBUTROL 1 MMOL/ML IV SOLN COMPARISON:  CT abdomen pelvis, 01/29/2020, right upper quadrant ultrasound, 02/06/2020 FINDINGS: Lower chest: Moderate right pleural effusion and associated atelectasis or consolidation. Hepatobiliary: No mass or other parenchymal abnormality identified. Mildly distended gallbladder containing multiple gallstones and sludge. No biliary ductal dilatation or choledocholithiasis. No gallbladder wall thickening or pericholecystic fluid. Incidental note is made of a low insertion of  the duct on the common bile duct. Pancreas: No mass, inflammatory changes, or other parenchymal abnormality identified. Spleen:  Within normal limits in size and appearance. Adrenals/Urinary Tract: No masses identified. No evidence of hydronephrosis. Stomach/Bowel: Percutaneous gastrostomy. Visualized portions within the abdomen are otherwise unremarkable. Vascular/Lymphatic: No pathologically enlarged lymph nodes identified. No abdominal aortic  aneurysm demonstrated. Other:  None. Musculoskeletal: No suspicious bone lesions identified. IMPRESSION: 1. Mildly distended gallbladder containing multiple gallstones and sludge. No biliary ductal dilatation or choledocholithiasis. No gallbladder wall thickening or pericholecystic fluid. 2. Moderate right pleural effusion and associated atelectasis or consolidation. 3. Percutaneous gastrostomy. Electronically Signed   By: Lauralyn Primes M.D.   On: 02/09/2020 16:20    Assessment/Plan Active Problems:   Acute on chronic respiratory failure with hypoxia (HCC)   C3 spinal cord injury, sequela (HCC)   Aspiration pneumonia due to gastric secretions (HCC)   Increased tracheal secretions   Tracheostomy status (HCC)   1. Acute on chronic respiratory failure hypoxia we will continue with T collar trials titrate oxygen continue pulmonary toilet. 2. C3 spinal cord injury no change 3. Aspiration pneumonia treated clinically. 4. Retained secretions continue aggressive pulmonary toilet 5. Tracheostomy will be changed out today to a cuffless trach   I have personally seen and evaluated the patient, evaluated laboratory and imaging results, formulated the assessment and plan and placed orders. The Patient requires high complexity decision making with multiple systems involvement.  Rounds were done with the Respiratory Therapy Director and Staff therapists and discussed with nursing staff also.  Yevonne Pax, MD [facility] Pulmonary Critical Care Medicine Sleep Medicine

## 2020-02-10 DIAGNOSIS — S14103S Unspecified injury at C3 level of cervical spinal cord, sequela: Secondary | ICD-10-CM | POA: Diagnosis not present

## 2020-02-10 DIAGNOSIS — J9621 Acute and chronic respiratory failure with hypoxia: Secondary | ICD-10-CM | POA: Diagnosis not present

## 2020-02-10 DIAGNOSIS — J398 Other specified diseases of upper respiratory tract: Secondary | ICD-10-CM | POA: Diagnosis not present

## 2020-02-10 DIAGNOSIS — J690 Pneumonitis due to inhalation of food and vomit: Secondary | ICD-10-CM | POA: Diagnosis not present

## 2020-02-10 LAB — HEPATITIS PANEL, ACUTE
HCV Ab: NONREACTIVE
Hep A IgM: NONREACTIVE
Hep B C IgM: NONREACTIVE
Hepatitis B Surface Ag: NONREACTIVE

## 2020-02-10 LAB — URINALYSIS, ROUTINE W REFLEX MICROSCOPIC
Bacteria, UA: NONE SEEN
Glucose, UA: NEGATIVE mg/dL
Ketones, ur: NEGATIVE mg/dL
Nitrite: NEGATIVE
Protein, ur: 30 mg/dL — AB
Specific Gravity, Urine: 1.018 (ref 1.005–1.030)
pH: 8 (ref 5.0–8.0)

## 2020-02-10 NOTE — Progress Notes (Signed)
Pulmonary Critical Care Medicine [facility] [place]   PULMONARY CRITICAL CARE SERVICE  PROGRESS NOTE  Date of Service: 02/10/2020  Anthony Andersen  YTK:[id number]  DOB: 06-24-1941   DOA: 02/05/2020  Referring Physician: Carron Curie, MD  HPI: Anthony Andersen is a 79 y.o. male seen for follow up of Acute on Chronic Respiratory Failure.  Patient continues on T collar right now has been on 20% FiO2 with no major issues noted.  GI did see the patient recommendation have been noted.  Medications: Reviewed on Rounds  Physical Exam:  Vitals: Temperature 100.6 pulse 106 respiratory rate 30 blood pressure is 136/74 saturations 96%  Ventilator Settings on T collar with an FiO2 of 28%  . General: Comfortable at this time . Eyes: Grossly normal lids, irises & conjunctiva . ENT: grossly tongue is normal . Neck: no obvious mass . Cardiovascular: S1 S2 normal no gallop . Respiratory: No rhonchi no rales noted at this time . Abdomen: soft . Skin: no rash seen on limited exam . Musculoskeletal: not rigid . Psychiatric:unable to assess . Neurologic: no seizure no involuntary movements         Lab Data:   Basic Metabolic Panel: Recent Labs  Lab 02/05/20 0324 02/06/20 0444 01/30/2020 0458 02/09/20 0720  NA 139 139 138 140  K 3.7 4.2 3.7 3.8  CL 99 100 101 102  CO2 29 31 30 30   GLUCOSE 127* 106* 102* 75  BUN 24* 27* 29* 25*  CREATININE 0.46* 0.45* 0.44* 0.56*  CALCIUM 8.0* 8.2* 8.0* 8.2*  MG 1.8 2.0 1.8  --   PHOS 3.2  --  4.0  --     ABG: No results for input(s): PHART, PCO2ART, PO2ART, HCO3, O2SAT in the last 168 hours.  Liver Function Tests: Recent Labs  Lab 02/06/20 0444 01/25/2020 1212 02/09/20 0720  AST 73* 67* 60*  ALT 106* 102* 77*  ALKPHOS 472* 500* 457*  BILITOT 6.0* 6.2* 6.9*  PROT 5.8* 6.2* 5.8*  ALBUMIN 1.5* 1.7* 1.6*   Recent Labs  Lab 02/11/2020 1212  LIPASE 34   No results for input(s): AMMONIA in the last 168 hours.  CBC: Recent Labs  Lab  02/05/20 0324 02/16/2020 0458 02/09/20 0720  WBC 7.4 6.2 5.5  HGB 8.4* 8.8* 8.6*  HCT 27.4* 27.5* 27.5*  MCV 91.6 89.9 91.1  PLT 354 366 330    Cardiac Enzymes: No results for input(s): CKTOTAL, CKMB, CKMBINDEX, TROPONINI in the last 168 hours.  BNP (last 3 results) No results for input(s): BNP in the last 8760 hours.  ProBNP (last 3 results) No results for input(s): PROBNP in the last 8760 hours.  Radiological Exams: MR ABDOMEN MRCP W WO CONTAST  Result Date: 02/09/2020 CLINICAL DATA:  Cholelithiasis, status post percutaneous gastrostomy EXAM: MRI ABDOMEN WITHOUT AND WITH CONTRAST (INCLUDING MRCP) TECHNIQUE: Multiplanar multisequence MR imaging of the abdomen was performed both before and after the administration of intravenous contrast. Heavily T2-weighted images of the biliary and pancreatic ducts were obtained, and three-dimensional MRCP images were rendered by post processing. Per technologist note, patient was unable to follow free breathing commands. CONTRAST:  18mL GADAVIST GADOBUTROL 1 MMOL/ML IV SOLN COMPARISON:  CT abdomen pelvis, 01/29/2020, right upper quadrant ultrasound, 02/06/2020 FINDINGS: Lower chest: Moderate right pleural effusion and associated atelectasis or consolidation. Hepatobiliary: No mass or other parenchymal abnormality identified. Mildly distended gallbladder containing multiple gallstones and sludge. No biliary ductal dilatation or choledocholithiasis. No gallbladder wall thickening or pericholecystic fluid. Incidental note is  made of a low insertion of the duct on the common bile duct. Pancreas: No mass, inflammatory changes, or other parenchymal abnormality identified. Spleen:  Within normal limits in size and appearance. Adrenals/Urinary Tract: No masses identified. No evidence of hydronephrosis. Stomach/Bowel: Percutaneous gastrostomy. Visualized portions within the abdomen are otherwise unremarkable. Vascular/Lymphatic: No pathologically enlarged lymph nodes  identified. No abdominal aortic aneurysm demonstrated. Other:  None. Musculoskeletal: No suspicious bone lesions identified. IMPRESSION: 1. Mildly distended gallbladder containing multiple gallstones and sludge. No biliary ductal dilatation or choledocholithiasis. No gallbladder wall thickening or pericholecystic fluid. 2. Moderate right pleural effusion and associated atelectasis or consolidation. 3. Percutaneous gastrostomy. Electronically Signed   By: Eddie Candle M.D.   On: 02/09/2020 16:20    Assessment/Plan Active Problems:   Acute on chronic respiratory failure with hypoxia (HCC)   C3 spinal cord injury, sequela (HCC)   Aspiration pneumonia due to gastric secretions (HCC)   Increased tracheal secretions   Tracheostomy status ([place])   1. Acute on chronic respiratory failure with hypoxia patient continues on T collar weaning at this time has been doing fine with 28% FiO2.  Patient now also has a low-grade fever which is going to need to monitor closely unclear if it might be related to the underlying gallstones 2. C3 spinal cord injury no change we will continue supportive care 3. Aspiration pneumonia has been treated we will continue monitor 4. Excessive tracheal secretions continue aggressive pulmonary toilet. 5. Tracheostomy remains in place   I have personally seen and evaluated the patient, evaluated laboratory and imaging results, formulated the assessment and plan and placed orders. The Patient requires high complexity decision making with multiple systems involvement.  Rounds were done with the Respiratory Therapy Director and Staff therapists and discussed with nursing staff also.  Allyne Gee, MD [facility] Pulmonary Critical Care Medicine Sleep Medicine

## 2020-02-10 NOTE — Consult Note (Addendum)
[facility] Consult: 12:56 PM 02/10/2020  LOS: 0 days    Referring Provider: Dr Laren Everts  Primary Care Physician:  Dyann Ruddle, MD in [place] Primary Gastroenterologist:  [facility] pt     Reason for Consultation: Abnormal LFTs, cholestasis.   HPI: Anthony Andersen is a 79 y.o. male.   PMH Restless leg syndrome.  Sinusitis.  Prostate cancer, dx 06/2018, treated with targeted radiation beginning 10/2019.  DM 2.  Erectile dysfunction, status post penile implant.  OSA.  Hearing loss.  Surgeries include cyst thoracic spine, lumbar spine, knee, cataract extraction with lens implants bilaterally. Previous remote colonoscopy and EGD in [place] > 5 if not 10 yrs ago. S/p hemorrhoidectomy.  Acute admission 3/29 - 4/24 at [facility] following traumatic fall with C3-4 fracture.  S/p laminectomy, C-spine fusion.  Developed aspiration pneumonia leading to ventilator dependency, tracheostomy.  Difficult to wean off vent due to mucous plugging requiring bronchoscopy.  Dysphagia.  Essentially quadriplegic.  IR placed feeding G-tube on 02/02/2020 [facility]    Initial labs obtained here showed T bili 6 >> 6.9. alk phos 500 >> 457 AST/ALT 73/106 >> 60/77.  Lipase 34 Admission labs of 01/15/2020 showed T bili 1, alkaline phosphatase 80, AST/ALT 45/68. Acute hepatitis serologies negative. 01/29/20 CT scan abdomen pelvis for evaluation of anemia.  It showed gallbladder distention, likely due to fasting state.  Cholelithiasis.  Feeding tube terminating at ligament of Treitz.  Nonobstructing left renal calculus.  No intraperitoneal/retroperitoneal hemorrhage.  Bilateral atelectasis, small RLL effusion.  Penile prosthetic in place. 02/06/2020 abdominal ultrasound: Uncomplicated cholelithiasis.  CBD 5  mm. 02/09/2020 MRCP: Gallbladder contains multiple gallstones and sludge and is mildly distended.  No choledocholithiasis or biliary ductal dilatation.  Percutaneous gastrostomy in place.  Right pleural effusion.  Prior to the cervical spine injury, patient was active.  There have never been problems with his liver.  Currently not having abdominal pain, nausea, vomiting.  Wife says the jaundice became noticeable about 10 days ago.  She also says that his arms and legs have become swollen.  He is tolerating the tube feeds and is not allowed p.o. because of ongoing dysphagia.  He is weaned off the vent and there is a trach collar in place.   Past Medical History:  Diagnosis Date  . Acute on chronic respiratory failure with hypoxia ([place])   . Anxiety    "at times"  . Arthritis   . Aspiration pneumonia due to gastric secretions ([place])   . C3 spinal cord injury, sequela ([place])   . Headache   . High cholesterol   . History of bronchitis as a child   . Hypertension   . Increased tracheal secretions   . Nocturia   . Tracheostomy status ([place])   . Urinary frequency   . Wears glasses     Past Surgical History:  Procedure Laterality Date  . BACK SURGERY     x2  . COLONOSCOPY    . ESOPHAGOGASTRODUODENOSCOPY    . EYE SURGERY Bilateral    cataracts  .  HEMORRHOID SURGERY    . IR GASTROSTOMY TUBE MOD SED  02/02/2020  . KNEE ARTHROSCOPY Left   . LUMBAR LAMINECTOMY/DECOMPRESSION MICRODISCECTOMY N/A 09/06/2015   Procedure: Thorasic nine-eleven DECOMPRESSION;  Surgeon: Melina Schools, MD;  Location: [facility];  Service: Orthopedics;  Laterality: N/A;    Prior to Admission medications   Medication Sig Start Date End Date Taking? Authorizing Provider  gabapentin (NEURONTIN) 300 MG capsule Take 300 mg by mouth 3 (three) times daily. 06/16/15   [provider]  methocarbamol (ROBAXIN) 500 MG tablet Take 1 tablet (500 mg total) by mouth 3 (three) times daily as needed for muscle spasms. 09/06/15    Melina Schools, MD  Multiple Vitamins-Minerals (MULTIVITAMIN ADULT PO) Take 1 tablet by mouth daily.    [provider]  ondansetron (ZOFRAN) 4 MG tablet Take 1 tablet (4 mg total) by mouth every 8 (eight) hours as needed for nausea or vomiting. 09/06/15   Melina Schools, MD  oxyCODONE-acetaminophen (PERCOCET) 10-325 MG tablet Take 1 tablet by mouth every 4 (four) hours as needed for pain. 09/06/15   Melina Schools, MD  ZETIA 10 MG tablet Take 10 mg by mouth daily. 05/18/15   [provider]    Scheduled Meds: Humalog insulin.   81 mg aspirin baclofen Diltiazem Gabapentin Lantus insulin Lovenox Magnesium oxide Metoprolol MiraLAX Protonix 40 mg bid Prostat protein supplement Glucerna tube feedings 40 mL/hour.   Senna   Infusions:  PRN Meds:    Allergies as of 02/19/2020  . (No Known Allergies)    Family History  Problem Relation Age of Onset  . Diabetes Mellitus I Unknown   . Hypertension Unknown   . Cancer Mother   . Cancer Father   . Cancer Maternal Grandmother   . Cancer Paternal Grandfather     Social History   Socioeconomic History  . Marital status: Married    Spouse name: Vaughan Basta  . Number of children: 1  . Years of education: 32  . Highest education level: Not on file  Occupational History  . Occupation: Self-employed   Tobacco Use  . Smoking status: Never Smoker  Substance and Sexual Activity  . Alcohol use: Yes    Alcohol/week: 0.0 standard drinks    Comment: Occasional   . Drug use: No  . Sexual activity: Not on file  Other Topics Concern  . Not on file  Social History Narrative   Lives at home with spouse.   Caffeine use: 1 cup coffee every morning   Social Determinants of Health   Financial Resource Strain:   . Difficulty of Paying Living Expenses:   Food Insecurity:   . Worried About Charity fundraiser in the Last Year:   . Arboriculturist in the Last Year:   Transportation Needs:   . Film/video editor  (Medical):   Marland Kitchen Lack of Transportation (Non-Medical):   Physical Activity:   . Days of Exercise per Week:   . Minutes of Exercise per Session:   Stress:   . Feeling of Stress :   Social Connections:   . Frequency of Communication with Friends and Family:   . Frequency of Social Gatherings with Friends and Family:   . Attends Religious Services:   . Active Member of Clubs or Organizations:   . Attends Archivist Meetings:   Marland Kitchen Marital Status:   Intimate Partner Violence:   . Fear of Current or Ex-Partner:   . Emotionally Abused:   Marland Kitchen Physically Abused:   .  Sexually Abused:     REVIEW OF SYSTEMS: Constitutional: Functional quadriplegic.  Does not feel particularly fatigued. ENT:  No nose bleeds Pulm: Copious secretions from the trach. CV:  No palpitations, no LE edema.  No angina GU:  No hematuria, no frequency GI: See HPI Heme: No unusual or excessive bleeding or bruising. Transfusions: None Neuro:  No headaches, no seizures, no syncope. Derm:  No itching, no rash or sores.  Endocrine:  No sweats or chills.  No polyuria or dysuria Immunization: Not queried.    PHYSICAL EXAM: Vital signs in last 24 hours: There were no vitals filed for this visit. Wt Readings from Last 3 Encounters:  09/06/15 115.7 kg  08/31/15 115.9 kg  06/21/15 101.1 kg    General: Jaundiced, moderately ill-appearing elderly WM who is alert. Head: No signs of head trauma.  Facies symmetric without edema Eyes: Sclera sclera icteric.  No conjunctival pallor Ears: Somewhat HOH Nose: No discharge or congestion. Mouth: Tongue midline.  Oral mucosa pink, moist, clear. Neck: C-spine collar in place not removed for exam.  Trach collar in place. Lungs: Rhonchi.   Frequent cough with wet, mucoid sounds. Heart: RRR. Abdomen: Soft without tenderness.  No HSM, masses, bruits, hernias.  Does have sensation to my exam.   Rectal: Deferred Musc/Skeltl: No joint redness or gross deformity. Extremities:  Nonpitting edema in the arms and lesser, also nonpitting, edema in the legs. Neurologic: Nods appropriately to my questions.  Difficult for him to speak with a trach in place.  Not able to move his arms or fingers.  Is able to slowly wiggle the toes on the right.  Unable to move his legs Skin: Jaundiced Tattoos: None noted   Psych: Cooperative, engaged but flat affect.  Intake/Output from previous day: No intake/output data recorded. Intake/Output this shift: No intake/output data recorded.  LAB RESULTS: Recent Labs    02/09/20 0720  WBC 5.5  HGB 8.6*  HCT 27.5*  PLT 330   BMET Lab Results  Component Value Date   NA 140 02/09/2020   NA 138 02/19/2020   NA 139 02/06/2020   K 3.8 02/09/2020   K 3.7 02/04/2020   K 4.2 02/06/2020   CL 102 02/09/2020   CL 101 01/26/2020   CL 100 02/06/2020   CO2 30 02/09/2020   CO2 30 02/14/2020   CO2 31 02/06/2020   GLUCOSE 75 02/09/2020   GLUCOSE 102 (H) 01/29/2020   GLUCOSE 106 (H) 02/06/2020   BUN 25 (H) 02/09/2020   BUN 29 (H) 02/20/2020   BUN 27 (H) 02/06/2020   CREATININE 0.56 (L) 02/09/2020   CREATININE 0.44 (L) 01/29/2020   CREATININE 0.45 (L) 02/06/2020   CALCIUM 8.2 (L) 02/09/2020   CALCIUM 8.0 (L) 02/21/2020   CALCIUM 8.2 (L) 02/06/2020   LFT Recent Labs    02/09/20 0720  PROT 5.8*  ALBUMIN 1.6*  AST 60*  ALT 77*  ALKPHOS 457*  BILITOT 6.9*  BILIDIR 4.6*  IBILI 2.3*   PT/INR Lab Results  Component Value Date   INR 1.2 02/09/2020   INR 1.2 02/02/2020   Hepatitis Panel Recent Labs    02/09/20 1944  HEPBSAG NON REACTIVE  HCVAB NON REACTIVE  HEPAIGM NON REACTIVE  HEPBIGM NON REACTIVE   C-Diff No components found for: CDIFF Lipase     Component Value Date/Time   LIPASE 34 02/11/2020 1212    Drugs of Abuse  No results found for: LABOPIA, COCAINSCRNUR, LABBENZ, AMPHETMU, THCU, LABBARB   RADIOLOGY STUDIES:  MR ABDOMEN MRCP W WO CONTAST  Result Date: 02/09/2020 CLINICAL DATA:  Cholelithiasis,  status post percutaneous gastrostomy EXAM: MRI ABDOMEN WITHOUT AND WITH CONTRAST (INCLUDING MRCP) TECHNIQUE: Multiplanar multisequence MR imaging of the abdomen was performed both before and after the administration of intravenous contrast. Heavily T2-weighted images of the biliary and pancreatic ducts were obtained, and three-dimensional MRCP images were rendered by post processing. Per technologist note, patient was unable to follow free breathing commands. CONTRAST:  32m GADAVIST GADOBUTROL 1 MMOL/ML IV SOLN COMPARISON:  CT abdomen pelvis, 01/29/2020, right upper quadrant ultrasound, 02/06/2020 FINDINGS: Lower chest: Moderate right pleural effusion and associated atelectasis or consolidation. Hepatobiliary: No mass or other parenchymal abnormality identified. Mildly distended gallbladder containing multiple gallstones and sludge. No biliary ductal dilatation or choledocholithiasis. No gallbladder wall thickening or pericholecystic fluid. Incidental note is made of a low insertion of the duct on the common bile duct. Pancreas: No mass, inflammatory changes, or other parenchymal abnormality identified. Spleen:  Within normal limits in size and appearance. Adrenals/Urinary Tract: No masses identified. No evidence of hydronephrosis. Stomach/Bowel: Percutaneous gastrostomy. Visualized portions within the abdomen are otherwise unremarkable. Vascular/Lymphatic: No pathologically enlarged lymph nodes identified. No abdominal aortic aneurysm demonstrated. Other:  None. Musculoskeletal: No suspicious bone lesions identified. IMPRESSION: 1. Mildly distended gallbladder containing multiple gallstones and sludge. No biliary ductal dilatation or choledocholithiasis. No gallbladder wall thickening or pericholecystic fluid. 2. Moderate right pleural effusion and associated atelectasis or consolidation. 3. Percutaneous gastrostomy. Electronically Signed   By: AEddie CandleM.D.   On: 02/09/2020 16:20     IMPRESSION:   *   Cholestatic jaundice progressing over the last 3 weeks.  No prior history of jaundice.  Acute hepatitis panel negative.  Uncomplicated gallstones on imaging including CT, Ultrasound and MRCP.   All of his imaging is essentially unremarkable and unrevealing.  No obvious culprit for drug-induced liver injury meds on his list.  *   Spinal cord injury with resulting quadriplegia, resp failure, dysphagia.    *    History prostate cancer.  Treated with radiation beginning 10/2018. No evidence of liver mets on recent imaging.     PLAN:     *   Per Dr JArdis Hughs  Ordering labs for AIH, metabollic causes for jaundice.     SAzucena Freed 02/10/2020, 12:56 PM Phone [phone]  ________________________________________________________________________  LVelora HecklerGI MD note:  I personally examined the patient, reviewed the data and agree with the assessment and plan described above. No evidence of biliary obstruction on any of his several imaging studies.  He is not tender on exam, he had wife tell me he does have good enough sensation in his abd that he senses pain well.   Need to wean him to only absolutely necessary medicines in case he is having an unusual side effect of one of them.  We are checking labs for other potential causes.  Could consider HIDA scan, perhaps unsual presentation of cholecystis.  DOwens Loffler MD [facility] Pager [phone]

## 2020-02-11 ENCOUNTER — Other Ambulatory Visit (HOSPITAL_COMMUNITY): Payer: Self-pay

## 2020-02-11 DIAGNOSIS — R17 Unspecified jaundice: Secondary | ICD-10-CM

## 2020-02-11 DIAGNOSIS — J690 Pneumonitis due to inhalation of food and vomit: Secondary | ICD-10-CM | POA: Diagnosis not present

## 2020-02-11 DIAGNOSIS — J398 Other specified diseases of upper respiratory tract: Secondary | ICD-10-CM | POA: Diagnosis not present

## 2020-02-11 DIAGNOSIS — S14103S Unspecified injury at C3 level of cervical spinal cord, sequela: Secondary | ICD-10-CM | POA: Diagnosis not present

## 2020-02-11 DIAGNOSIS — J9621 Acute and chronic respiratory failure with hypoxia: Secondary | ICD-10-CM | POA: Diagnosis not present

## 2020-02-11 LAB — HEPATIC FUNCTION PANEL
ALT: 65 U/L — ABNORMAL HIGH (ref 0–44)
AST: 53 U/L — ABNORMAL HIGH (ref 15–41)
Albumin: 1.7 g/dL — ABNORMAL LOW (ref 3.5–5.0)
Alkaline Phosphatase: 482 U/L — ABNORMAL HIGH (ref 38–126)
Bilirubin, Direct: 5.5 mg/dL — ABNORMAL HIGH (ref 0.0–0.2)
Indirect Bilirubin: 2.7 mg/dL — ABNORMAL HIGH (ref 0.3–0.9)
Total Bilirubin: 8.2 mg/dL — ABNORMAL HIGH (ref 0.3–1.2)
Total Protein: 6.1 g/dL — ABNORMAL LOW (ref 6.5–8.1)

## 2020-02-11 LAB — CBC
HCT: 27.1 % — ABNORMAL LOW (ref 39.0–52.0)
Hemoglobin: 8.6 g/dL — ABNORMAL LOW (ref 13.0–17.0)
MCH: 28.3 pg (ref 26.0–34.0)
MCHC: 31.7 g/dL (ref 30.0–36.0)
MCV: 89.1 fL (ref 80.0–100.0)
Platelets: 283 10*3/uL (ref 150–400)
RBC: 3.04 MIL/uL — ABNORMAL LOW (ref 4.22–5.81)
RDW: 17.3 % — ABNORMAL HIGH (ref 11.5–15.5)
WBC: 5.3 10*3/uL (ref 4.0–10.5)
nRBC: 0 % (ref 0.0–0.2)

## 2020-02-11 LAB — BASIC METABOLIC PANEL
Anion gap: 9 (ref 5–15)
BUN: 26 mg/dL — ABNORMAL HIGH (ref 8–23)
CO2: 28 mmol/L (ref 22–32)
Calcium: 8.2 mg/dL — ABNORMAL LOW (ref 8.9–10.3)
Chloride: 101 mmol/L (ref 98–111)
Creatinine, Ser: 0.53 mg/dL — ABNORMAL LOW (ref 0.61–1.24)
GFR calc Af Amer: >60 mL/min (ref >60)
GFR calc non Af Amer: >60 mL/min (ref >60)
Glucose, Bld: 150 mg/dL — ABNORMAL HIGH (ref 70–99)
Potassium: 3.7 mmol/L (ref 3.5–5.1)
Sodium: 138 mmol/L (ref 135–145)

## 2020-02-11 NOTE — Progress Notes (Addendum)
Progress Note  CC:    Abnormal liver tests      ASSESSMENT AND PLAN:   # Elevated LFTs, mainly cholestatic and progressive / cholelithiasis --No abdominal pain or biliary ductal dilatation on imaging to suggest CBD obstruction.  However no culprit medications identified and his progressive hyperbilirubinemia is predominantly direct.  --AMA, ASMA, ANA and other labs for chronic liver disease are still pending.  --For evaluation of worsening cholestasis in setting of cholelithiasis will plan for endoscopic testing tomorrow.  Will plan for EUS and if stones are noted then will proceed with ERCP to remove them . Sometimes small stones not detected on MRCP can be found by EUS.   --Wife to be visiting patient soon. I will come back and speak with her to get consent but I did discussed with patient as well.  --Will hold SQ Lovenox in preparation for procedure.     SUBJECTIVE    No abdominal pain.    OBJECTIVE:     Vital signs in last 24 hours:  0700 98.7-84-26-115/64-98% 02   General:   Alert, in NAD Heart:  Regular rate and rhythm.  No lower extremity edema   Pulm: Normal respiratory effort . Trach collar on Abdomen:  Soft,  nontender, nondistended.  Normal bowel sounds.          Psych:  Pleasant.   Intake/Output from previous day: No intake/output data recorded. Intake/Output this shift: No intake/output data recorded.  Lab Results: Recent Labs    02/09/20 0720 02/11/20 0546  WBC 5.5 5.3  HGB 8.6* 8.6*  HCT 27.5* 27.1*  PLT 330 283   BMET Recent Labs    02/09/20 0720 02/11/20 0546  NA 140 138  K 3.8 3.7  CL 102 101  CO2 30 28  GLUCOSE 75 150*  BUN 25* 26*  CREATININE 0.56* 0.53*  CALCIUM 8.2* 8.2*   LFT Recent Labs    02/11/20 0546  PROT 6.1*  ALBUMIN 1.7*  AST 53*  ALT 65*  ALKPHOS 482*  BILITOT 8.2*  BILIDIR 5.5*  IBILI 2.7*   PT/INR Recent Labs    02/09/20 0720  LABPROT 14.6  INR 1.2   Hepatitis Panel Recent Labs     02/09/20 1944  HEPBSAG NON REACTIVE  HCVAB NON REACTIVE  HEPAIGM NON REACTIVE  HEPBIGM NON REACTIVE    DG CHEST PORT 1 VIEW  Result Date: 02/11/2020 CLINICAL DATA:  Fever. EXAM: PORTABLE CHEST 1 VIEW COMPARISON:  01/25/2020 FINDINGS: The cardiac silhouette, mediastinal and hilar contours are stable. The tracheostomy tube is stable. Persistent right lung airspace process and pleural effusion. The left lung remains relatively clear. IMPRESSION: Persistent right lung airspace process and right pleural effusion. Electronically Signed   By: Rudie Meyer M.D.   On: 02/11/2020 07:01   MR ABDOMEN MRCP W WO CONTAST  Result Date: 02/09/2020 CLINICAL DATA:  Cholelithiasis, status post percutaneous gastrostomy EXAM: MRI ABDOMEN WITHOUT AND WITH CONTRAST (INCLUDING MRCP) TECHNIQUE: Multiplanar multisequence MR imaging of the abdomen was performed both before and after the administration of intravenous contrast. Heavily T2-weighted images of the biliary and pancreatic ducts were obtained, and three-dimensional MRCP images were rendered by post processing. Per technologist note, patient was unable to follow free breathing commands. CONTRAST:  53mL GADAVIST GADOBUTROL 1 MMOL/ML IV SOLN COMPARISON:  CT abdomen pelvis, 01/29/2020, right upper quadrant ultrasound, 02/06/2020 FINDINGS: Lower chest: Moderate right pleural effusion and associated atelectasis or consolidation. Hepatobiliary: No mass or other parenchymal abnormality identified. Mildly distended  gallbladder containing multiple gallstones and sludge. No biliary ductal dilatation or choledocholithiasis. No gallbladder wall thickening or pericholecystic fluid. Incidental note is made of a low insertion of the duct on the common bile duct. Pancreas: No mass, inflammatory changes, or other parenchymal abnormality identified. Spleen:  Within normal limits in size and appearance. Adrenals/Urinary Tract: No masses identified. No evidence of hydronephrosis.  Stomach/Bowel: Percutaneous gastrostomy. Visualized portions within the abdomen are otherwise unremarkable. Vascular/Lymphatic: No pathologically enlarged lymph nodes identified. No abdominal aortic aneurysm demonstrated. Other:  None. Musculoskeletal: No suspicious bone lesions identified. IMPRESSION: 1. Mildly distended gallbladder containing multiple gallstones and sludge. No biliary ductal dilatation or choledocholithiasis. No gallbladder wall thickening or pericholecystic fluid. 2. Moderate right pleural effusion and associated atelectasis or consolidation. 3. Percutaneous gastrostomy. Electronically Signed   By: Eddie Candle M.D.   On: 02/09/2020 16:20      LOS: 0 days   Tye Savoy ,NP 02/11/2020, 10:37 AM      ________________________________________________________________________  Velora Heckler GI MD note:  I  reviewed the data and agree with the assessment and plan described above.   Owens Loffler, MD [facility]enterology Pager [phone]

## 2020-02-11 NOTE — Progress Notes (Addendum)
Pulmonary Critical Care Medicine [place]   PULMONARY CRITICAL CARE SERVICE  PROGRESS NOTE  Date of Service: 02/11/2020  Anthony Andersen  PNT:[id number]  DOB: 02-24-1941   DOA: 02/09/2020  Referring Physician: Merton Border, MD  HPI: Anthony Andersen is a 79 y.o. male seen for follow up of Acute on Chronic Respiratory Failure.  Patient mains on 20% aerosol trach collar using PMV with no difficulty satting well does have a large amount of secretions noted.  Medications: Reviewed on Rounds  Physical Exam:  Vitals: Pulse 93 respiration 22 BP 112/63 O2 sat 97% temp 97.6  Ventilator Settings 28% ATC  . General: Comfortable at this time . Eyes: Grossly normal lids, irises & conjunctiva . ENT: grossly tongue is normal . Neck: no obvious mass . Cardiovascular: S1 S2 normal no gallop . Respiratory: No rales or rhonchi noted . Abdomen: soft . Skin: no rash seen on limited exam . Musculoskeletal: not rigid . Psychiatric:unable to assess . Neurologic: no seizure no involuntary movements         Lab Data:   Basic Metabolic Panel: Recent Labs  Lab 02/05/20 0324 02/06/20 0444 2020-02-09 0458 02/09/20 0720 02/11/20 0546  NA 139 139 138 140 138  K 3.7 4.2 3.7 3.8 3.7  CL 99 100 101 102 101  CO2 29 31 30 30 28   GLUCOSE 127* 106* 102* 75 150*  BUN 24* 27* 29* 25* 26*  CREATININE 0.46* 0.45* 0.44* 0.56* 0.53*  CALCIUM 8.0* 8.2* 8.0* 8.2* 8.2*  MG 1.8 2.0 1.8  --   --   PHOS 3.2  --  4.0  --   --     ABG: No results for input(s): PHART, PCO2ART, PO2ART, HCO3, O2SAT in the last 168 hours.  Liver Function Tests: Recent Labs  Lab 02/06/20 0444 2020/02/09 1212 02/09/20 0720 02/11/20 0546  AST 73* 67* 60* 53*  ALT 106* 102* 77* 65*  ALKPHOS 472* 500* 457* 482*  BILITOT 6.0* 6.2* 6.9* 8.2*  PROT 5.8* 6.2* 5.8* 6.1*  ALBUMIN 1.5* 1.7* 1.6* 1.7*   Recent Labs  Lab 02/09/2020 1212  LIPASE 34   No results for input(s): AMMONIA in the last 168  hours.  CBC: Recent Labs  Lab 02/05/20 0324 Feb 09, 2020 0458 02/09/20 0720 02/11/20 0546  WBC 7.4 6.2 5.5 5.3  HGB 8.4* 8.8* 8.6* 8.6*  HCT 27.4* 27.5* 27.5* 27.1*  MCV 91.6 89.9 91.1 89.1  PLT 354 366 330 283    Cardiac Enzymes: No results for input(s): CKTOTAL, CKMB, CKMBINDEX, TROPONINI in the last 168 hours.  BNP (last 3 results) No results for input(s): BNP in the last 8760 hours.  ProBNP (last 3 results) No results for input(s): PROBNP in the last 8760 hours.  Radiological Exams: DG CHEST PORT 1 VIEW  Result Date: 02/11/2020 CLINICAL DATA:  Fever. EXAM: PORTABLE CHEST 1 VIEW COMPARISON:  01/25/2020 FINDINGS: The cardiac silhouette, mediastinal and hilar contours are stable. The tracheostomy tube is stable. Persistent right lung airspace process and pleural effusion. The left lung remains relatively clear. IMPRESSION: Persistent right lung airspace process and right pleural effusion. Electronically Signed   By: Marijo Sanes M.D.   On: 02/11/2020 07:01   MR ABDOMEN MRCP W WO CONTAST  Result Date: 02/09/2020 CLINICAL DATA:  Cholelithiasis, status post percutaneous gastrostomy EXAM: MRI ABDOMEN WITHOUT AND WITH CONTRAST (INCLUDING MRCP) TECHNIQUE: Multiplanar multisequence MR imaging of the abdomen was performed both before and after the administration of intravenous contrast. Heavily T2-weighted images of  the biliary and pancreatic ducts were obtained, and three-dimensional MRCP images were rendered by post processing. Per technologist note, patient was unable to follow free breathing commands. CONTRAST:  12mL GADAVIST GADOBUTROL 1 MMOL/ML IV SOLN COMPARISON:  CT abdomen pelvis, 01/29/2020, right upper quadrant ultrasound, 02/06/2020 FINDINGS: Lower chest: Moderate right pleural effusion and associated atelectasis or consolidation. Hepatobiliary: No mass or other parenchymal abnormality identified. Mildly distended gallbladder containing multiple gallstones and sludge. No biliary  ductal dilatation or choledocholithiasis. No gallbladder wall thickening or pericholecystic fluid. Incidental note is made of a low insertion of the duct on the common bile duct. Pancreas: No mass, inflammatory changes, or other parenchymal abnormality identified. Spleen:  Within normal limits in size and appearance. Adrenals/Urinary Tract: No masses identified. No evidence of hydronephrosis. Stomach/Bowel: Percutaneous gastrostomy. Visualized portions within the abdomen are otherwise unremarkable. Vascular/Lymphatic: No pathologically enlarged lymph nodes identified. No abdominal aortic aneurysm demonstrated. Other:  None. Musculoskeletal: No suspicious bone lesions identified. IMPRESSION: 1. Mildly distended gallbladder containing multiple gallstones and sludge. No biliary ductal dilatation or choledocholithiasis. No gallbladder wall thickening or pericholecystic fluid. 2. Moderate right pleural effusion and associated atelectasis or consolidation. 3. Percutaneous gastrostomy. Electronically Signed   By: Lauralyn Primes M.D.   On: 02/09/2020 16:20    Assessment/Plan Active Problems:   Acute on chronic respiratory failure with hypoxia (HCC)   C3 spinal cord injury, sequela ([facility])   Aspiration pneumonia due to gastric secretions ([facility]CC)   Increased tracheal secretions   Tracheostomy status ([facility])   1. Acute on chronic respiratory failure with hypoxia patient continues to use 28% aerosol trach collar use with PMV satting well does have a moderate to large amount of secretions will try scopolamine at this time.  Continue aggressive pulmonary toilet supportive measures. 2. C3 spinal cord injury no change we will continue supportive care 3. Aspiration pneumonia has been treated we will continue monitor 4. Excessive tracheal secretions continue aggressive pulmonary toilet. 5. Tracheostomy remains in place   I have personally seen and evaluated the patient, evaluated laboratory and imaging results, formulated  the assessment and plan and placed orders. The Patient requires high complexity decision making with multiple systems involvement.  Rounds were done with the Respiratory Therapy Director and Staff therapists and discussed with nursing staff also.  Yevonne Pax, MD [facility] Pulmonary Critical Care Medicine Sleep Medicine

## 2020-02-11 NOTE — H&P (View-Only) (Signed)
Progress Note  CC:    Abnormal liver tests      ASSESSMENT AND PLAN:   # Elevated LFTs, mainly cholestatic and progressive / cholelithiasis --No abdominal pain or biliary ductal dilatation on imaging to suggest CBD obstruction.  However no culprit medications identified and his progressive hyperbilirubinemia is predominantly direct.  --AMA, ASMA, ANA and other labs for chronic liver disease are still pending.  --For evaluation of worsening cholestasis in setting of cholelithiasis will plan for endoscopic testing tomorrow.  Will plan for EUS and if stones are noted then will proceed with ERCP to remove them . Sometimes small stones not detected on MRCP can be found by EUS.   --Wife to be visiting patient soon. I will come back and speak with her to get consent but I did discussed with patient as well.  --Will hold SQ Lovenox in preparation for procedure.     SUBJECTIVE    No abdominal pain.    OBJECTIVE:     Vital signs in last 24 hours:  0700 98.7-84-26-115/64-98% 02   General:   Alert, in NAD Heart:  Regular rate and rhythm.  No lower extremity edema   Pulm: Normal respiratory effort . Trach collar on Abdomen:  Soft,  nontender, nondistended.  Normal bowel sounds.          Psych:  Pleasant.   Intake/Output from previous day: No intake/output data recorded. Intake/Output this shift: No intake/output data recorded.  Lab Results: Recent Labs    02/09/20 0720 02/11/20 0546  WBC 5.5 5.3  HGB 8.6* 8.6*  HCT 27.5* 27.1*  PLT 330 283   BMET Recent Labs    02/09/20 0720 02/11/20 0546  NA 140 138  K 3.8 3.7  CL 102 101  CO2 30 28  GLUCOSE 75 150*  BUN 25* 26*  CREATININE 0.56* 0.53*  CALCIUM 8.2* 8.2*   LFT Recent Labs    02/11/20 0546  PROT 6.1*  ALBUMIN 1.7*  AST 53*  ALT 65*  ALKPHOS 482*  BILITOT 8.2*  BILIDIR 5.5*  IBILI 2.7*   PT/INR Recent Labs    02/09/20 0720  LABPROT 14.6  INR 1.2   Hepatitis Panel Recent Labs     02/09/20 1944  HEPBSAG NON REACTIVE  HCVAB NON REACTIVE  HEPAIGM NON REACTIVE  HEPBIGM NON REACTIVE    DG CHEST PORT 1 VIEW  Result Date: 02/11/2020 CLINICAL DATA:  Fever. EXAM: PORTABLE CHEST 1 VIEW COMPARISON:  01/25/2020 FINDINGS: The cardiac silhouette, mediastinal and hilar contours are stable. The tracheostomy tube is stable. Persistent right lung airspace process and pleural effusion. The left lung remains relatively clear. IMPRESSION: Persistent right lung airspace process and right pleural effusion. Electronically Signed   By: Rudie Meyer M.D.   On: 02/11/2020 07:01   MR ABDOMEN MRCP W WO CONTAST  Result Date: 02/09/2020 CLINICAL DATA:  Cholelithiasis, status post percutaneous gastrostomy EXAM: MRI ABDOMEN WITHOUT AND WITH CONTRAST (INCLUDING MRCP) TECHNIQUE: Multiplanar multisequence MR imaging of the abdomen was performed both before and after the administration of intravenous contrast. Heavily T2-weighted images of the biliary and pancreatic ducts were obtained, and three-dimensional MRCP images were rendered by post processing. Per technologist note, patient was unable to follow free breathing commands. CONTRAST:  53mL GADAVIST GADOBUTROL 1 MMOL/ML IV SOLN COMPARISON:  CT abdomen pelvis, 01/29/2020, right upper quadrant ultrasound, 02/06/2020 FINDINGS: Lower chest: Moderate right pleural effusion and associated atelectasis or consolidation. Hepatobiliary: No mass or other parenchymal abnormality identified. Mildly distended  gallbladder containing multiple gallstones and sludge. No biliary ductal dilatation or choledocholithiasis. No gallbladder wall thickening or pericholecystic fluid. Incidental note is made of a low insertion of the duct on the common bile duct. Pancreas: No mass, inflammatory changes, or other parenchymal abnormality identified. Spleen:  Within normal limits in size and appearance. Adrenals/Urinary Tract: No masses identified. No evidence of hydronephrosis.  Stomach/Bowel: Percutaneous gastrostomy. Visualized portions within the abdomen are otherwise unremarkable. Vascular/Lymphatic: No pathologically enlarged lymph nodes identified. No abdominal aortic aneurysm demonstrated. Other:  None. Musculoskeletal: No suspicious bone lesions identified. IMPRESSION: 1. Mildly distended gallbladder containing multiple gallstones and sludge. No biliary ductal dilatation or choledocholithiasis. No gallbladder wall thickening or pericholecystic fluid. 2. Moderate right pleural effusion and associated atelectasis or consolidation. 3. Percutaneous gastrostomy. Electronically Signed   By: Alex  Bibbey M.D.   On: 02/09/2020 16:20      LOS: 0 days   Paula Guenther ,NP 02/11/2020, 10:37 AM      ________________________________________________________________________  [facility] MD note:  I  reviewed the data and agree with the assessment and plan described above.   Nashly Olsson, MD [facility] Pager [phone]  

## 2020-02-12 ENCOUNTER — Encounter (HOSPITAL_COMMUNITY): Payer: Self-pay | Admitting: Anesthesiology

## 2020-02-12 ENCOUNTER — Encounter: Payer: Self-pay | Admitting: Internal Medicine

## 2020-02-12 ENCOUNTER — Encounter: Admission: AD | Disposition: E | Payer: Self-pay | Attending: Internal Medicine

## 2020-02-12 DIAGNOSIS — K8070 Calculus of gallbladder and bile duct without cholecystitis without obstruction: Secondary | ICD-10-CM

## 2020-02-12 DIAGNOSIS — R17 Unspecified jaundice: Secondary | ICD-10-CM

## 2020-02-12 HISTORY — PX: EUS: SHX5427

## 2020-02-12 LAB — URINE CULTURE: Culture: 70000 — AB

## 2020-02-12 LAB — MITOCHONDRIAL ANTIBODIES: Mitochondrial M2 Ab, IgG: <20 Units (ref 0.0–20.0)

## 2020-02-12 LAB — ALPHA-1-ANTITRYPSIN: A-1 Antitrypsin, Ser: 230 mg/dL — ABNORMAL HIGH (ref 101–187)

## 2020-02-12 LAB — CULTURE, RESPIRATORY W GRAM STAIN: Culture: NORMAL

## 2020-02-12 LAB — ANTI-SMOOTH MUSCLE ANTIBODY, IGG: F-Actin IgG: 4 Units (ref 0–19)

## 2020-02-12 LAB — CERULOPLASMIN: Ceruloplasmin: 19.2 mg/dL (ref 16.0–31.0)

## 2020-02-12 SURGERY — ULTRASOUND, UPPER GI TRACT, ENDOSCOPIC
Anesthesia: Monitor Anesthesia Care

## 2020-02-12 MED ORDER — LACTATED RINGERS IV SOLN
INTRAVENOUS | Status: AC | PRN
  Administered 2020-02-12: 1000 mL via INTRAVENOUS

## 2020-02-12 MED ORDER — INDOMETHACIN 50 MG RE SUPP
RECTAL | Status: AC
  Filled 2020-02-12: qty 2

## 2020-02-12 MED ORDER — LACTATED RINGERS IV SOLN: INTRAVENOUS | Status: DC | PRN

## 2020-02-12 MED ORDER — GLUCAGON HCL RDNA (DIAGNOSTIC) 1 MG IJ SOLR
INTRAMUSCULAR | Status: AC
  Filled 2020-02-12: qty 1

## 2020-02-12 MED ORDER — PROPOFOL 500 MG/50ML IV EMUL
INTRAVENOUS | Status: DC | PRN
  Administered 2020-02-12: 75 ug/kg/min via INTRAVENOUS

## 2020-02-12 MED ORDER — PHENYLEPHRINE HCL-NACL 10-0.9 MG/250ML-% IV SOLN
INTRAVENOUS | Status: DC | PRN
  Administered 2020-02-12: 40 ug/min via INTRAVENOUS

## 2020-02-12 NOTE — Interval H&P Note (Signed)
History and Physical Interval Note:  02/18/2020 10:28 AM  Anthony Andersen  has presented today for surgery, with the diagnosis of elevated liver function studies and cholelithiasis.  The various methods of treatment have been discussed with the patient and family. After consideration of risks, benefits and other options for treatment, the patient has consented to  Procedure(s): FULL UPPER ENDOSCOPIC ULTRASOUND (EUS) RADIAL (N/A) ENDOSCOPIC RETROGRADE CHOLANGIOPANCREATOGRAPHY (ERCP) (N/A) as a surgical intervention.  The patient's history has been reviewed, patient examined, no change in status, stable for surgery.  I have reviewed the patient's chart and labs.  Questions were answered to the patient's satisfaction.     Rachael Fee

## 2020-02-12 NOTE — Anesthesia Postprocedure Evaluation (Signed)
Anesthesia Post Note  Patient: Anthony Andersen  Procedure(s) Performed: FULL UPPER ENDOSCOPIC ULTRASOUND (EUS) RADIAL (N/A )     Patient location during evaluation: PACU Anesthesia Type: MAC Level of consciousness: awake Pain management: pain level controlled Vital Signs Assessment: post-procedure vital signs reviewed and stable Respiratory status: spontaneous breathing, nonlabored ventilation, respiratory function stable and patient connected to tracheostomy mask oxygen Cardiovascular status: stable and blood pressure returned to baseline Postop Assessment: no apparent nausea or vomiting Anesthetic complications: no    Last Vitals:  Vitals:   01/27/2020 1159 02/21/2020 1214  BP: 111/68 111/68  Pulse: 86 86  Resp: 15 20  Temp:  37.1 C  SpO2: 98% 99%    Last Pain:  Vitals:   02/11/2020 1214  TempSrc:   PainSc: 0-No pain                 Catalina Gravel

## 2020-02-12 NOTE — Transfer of Care (Signed)
Immediate Anesthesia Transfer of Care Note  Patient: Anthony Andersen  Procedure(s) Performed: FULL UPPER ENDOSCOPIC ULTRASOUND (EUS) RADIAL (N/A ) ENDOSCOPIC RETROGRADE CHOLANGIOPANCREATOGRAPHY (ERCP) (N/A )  Patient Location: PACU  Anesthesia Type:MAC  Level of Consciousness: drowsy  Airway & Oxygen Therapy: Patient Spontanous Breathing and Patient connected to tracheostomy mask oxygen  Post-op Assessment: Report given to RN and Post -op Vital signs reviewed and stable  Post vital signs: Reviewed and stable  Last Vitals:  Vitals Value Taken Time  BP 95/55 02/13/2020 1144  Temp 37.1 C 02/17/2020 1144  Pulse 84 02/03/2020 1145  Resp 8 02/11/2020 1145  SpO2 98 % 02/03/2020 1145  Vitals shown include unvalidated device data.  Last Pain:  Vitals:   01/31/2020 1033  TempSrc: Axillary  PainSc: 0-No pain         Complications: No apparent anesthesia complications

## 2020-02-12 NOTE — Op Note (Signed)
[facility] Patient Name: Anthony Andersen Procedure Date : 02/15/2020 MRN: [id number] Attending MD: Milus Banister , MD Date of Birth: October 22, 1940 CSN: [id number] Age: 79 Admit Type: Inpatient Procedure:                Upper EUS Indications:              Elevated bilirubin; nondilated biliary tree on MR,                            + stones in his gallbladder. Providers:                Milus Banister, MD, Glori Bickers, RN, William Dalton, Technician Referring MD:              Medicines:                Monitored Anesthesia Care Complications:            No immediate complications. Estimated blood loss:                            None. Estimated Blood Loss:     Estimated blood loss: none. Procedure:                Pre-Anesthesia Assessment:                           - Prior to the procedure, a History and Physical                            was performed, and patient medications and                            allergies were reviewed. The patient's tolerance of                            previous anesthesia was also reviewed. The risks                            and benefits of the procedure and the sedation                            options and risks were discussed with the patient.                            All questions were answered, and informed consent                            was obtained. Prior Anticoagulants: The patient has                            taken no previous anticoagulant or antiplatelet                            agents.  ASA Grade Assessment: III - A patient with                            severe systemic disease. After reviewing the risks                            and benefits, the patient was deemed in                            satisfactory condition to undergo the procedure.                           After obtaining informed consent, the endoscope was                            passed under direct vision. Throughout  the                            procedure, the patient's blood pressure, pulse, and                            oxygen saturations were monitored continuously. The                            [id number] ([id number]) Olympus Radial EUS scope was                            introduced through the mouth, and advanced to the                            duodenal bulb. The upper EUS was accomplished                            without difficulty. The patient tolerated the                            procedure well. Scope In: Scope Out: Findings:      ENDOSCOPIC FINDING: :      The examined esophagus was endoscopically normal.      The entire examined stomach was endoscopically normal except for       previously placed G tube internal bumper.      The examined duodenum was endoscopically normal.      ENDOSONOGRAPHIC FINDING: :      1. Extrahepatic biliary tree was slightly thick walled but was non       dilated (CBD 44mm, CHD 71mm) and contained no stones.      2. Pancreatic parenchyma was normal throughout; no discrete mass or       signs of chronic pancreatitis.      3. Gallbladder contained stones. Impression:               - Extrahepatic biliary tree was slightly thick                            walled but was non dilated (CBD 45mm, CHD 43mm)  and                            contained no stones. Recommendation:           - Return to [facility].                           - Please recheck a hepatic function panel on Monday                            morning. [facility] will return then.                           - OK to resume his previous diet.                           - I discussed the results with his wife on the                            phone. She understands he may need more testing                            such as a liver biospy however we are still waiting                            for some final bloodwork (ANA, AMA etc) to return. 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 All rights reserved. The codes documented in this report are preliminary and upon coder review may  be revised to meet current compliance requirements. Rachael Fee, MD 02/05/2020 11:48:02 AM This report has been signed electronically. Number of Addenda: 0

## 2020-02-12 NOTE — Anesthesia Preprocedure Evaluation (Addendum)
Anesthesia Evaluation  Patient identified by MRN, date of birth, ID band Patient awake  General Assessment Comment:Acute admission 3/29 - 4/24 at [facility] following traumatic fall with C3-4 fracture.  S/p laminectomy, C-spine fusion.  Developed aspiration pneumonia leading to ventilator dependency, tracheostomy  Reviewed: Allergy & Precautions, NPO status , Patient's Chart, lab work & pertinent test results  Airway Mallampati: Trach  TM Distance: >3 FB Neck ROM: Full    Dental  (+) Teeth Intact, Dental Advisory Given   Pulmonary  Tracheostomy: on 20% aerosol trach collar using PMV    breath sounds clear to auscultation   + intubated    Cardiovascular hypertension, Normal cardiovascular exam Rhythm:Regular Rate:Normal     Neuro/Psych  Headaches, PSYCHIATRIC DISORDERS Anxiety C3 spinal cord injury, sequela     GI/Hepatic negative GI ROS, Elevated liver function studies and cholelithiasis   Endo/Other  negative endocrine ROS  Renal/GU negative Renal ROS     Musculoskeletal  (+) Arthritis ,   Abdominal   Peds  Hematology  (+) Blood dyscrasia, anemia ,   Anesthesia Other Findings   Reproductive/Obstetrics                            Anesthesia Physical Anesthesia Plan  ASA: IV  Anesthesia Plan: General   Post-op Pain Management:    Induction: Intravenous and Inhalational  PONV Risk Score and Plan: 2 and Dexamethasone and Ondansetron  Airway Management Planned: Tracheostomy  Additional Equipment:   Intra-op Plan:   Post-operative Plan: Post-operative intubation/ventilation  Informed Consent: I have reviewed the patients History and Physical, chart, labs and discussed the procedure including the risks, benefits and alternatives for the proposed anesthesia with the patient or authorized representative who has indicated his/her understanding and acceptance.     Dental advisory  given  Plan Discussed with: CRNA  Anesthesia Plan Comments:        Anesthesia Quick Evaluation

## 2020-02-13 DIAGNOSIS — J398 Other specified diseases of upper respiratory tract: Secondary | ICD-10-CM | POA: Diagnosis not present

## 2020-02-13 DIAGNOSIS — J690 Pneumonitis due to inhalation of food and vomit: Secondary | ICD-10-CM | POA: Diagnosis not present

## 2020-02-13 DIAGNOSIS — S14103S Unspecified injury at C3 level of cervical spinal cord, sequela: Secondary | ICD-10-CM | POA: Diagnosis not present

## 2020-02-13 DIAGNOSIS — J9621 Acute and chronic respiratory failure with hypoxia: Secondary | ICD-10-CM | POA: Diagnosis not present

## 2020-02-13 NOTE — Progress Notes (Signed)
Pulmonary Critical Care Medicine [facility] [place]   PULMONARY CRITICAL CARE SERVICE  PROGRESS NOTE  Date of Service: 02/13/2020  TAMIM SKOG  INO:[id number]  DOB: [birth year]   DOA: 02/02/2020  Referring Physician: Carron Curie, MD  HPI: DALEY GOSSE is a 79 y.o. male seen for follow up of Acute on Chronic Respiratory Failure.  Patient is on comfortable right now without distress remains on T collar has been on 28% FiO2  Medications: Reviewed on Rounds  Physical Exam:  Vitals: Temperature is 97.5 pulse 90 respiratory 23 blood pressure is 107/64 saturations 97%  Ventilator Settings off ventilator on T collar FiO2 28%  . General: Comfortable at this time . Eyes: Grossly normal lids, irises & conjunctiva . ENT: grossly tongue is normal . Neck: no obvious mass . Cardiovascular: S1 S2 normal no gallop . Respiratory: No rhonchi coarse breath sounds . Abdomen: soft . Skin: no rash seen on limited exam . Musculoskeletal: not rigid . Psychiatric:unable to assess . Neurologic: no seizure no involuntary movements         Lab Data:   Basic Metabolic Panel: Recent Labs  Lab 02/09/2020 0458 02/09/20 0720 02/11/20 0546  NA 138 140 138  K 3.7 3.8 3.7  CL 101 102 101  CO2 30 30 28   GLUCOSE 102* 75 150*  BUN 29* 25* 26*  CREATININE 0.44* 0.56* 0.53*  CALCIUM 8.0* 8.2* 8.2*  MG 1.8  --   --   PHOS 4.0  --   --     ABG: No results for input(s): PHART, PCO2ART, PO2ART, HCO3, O2SAT in the last 168 hours.  Liver Function Tests: Recent Labs  Lab 02/16/2020 1212 02/09/20 0720 02/11/20 0546  AST 67* 60* 53*  ALT 102* 77* 65*  ALKPHOS 500* 457* 482*  BILITOT 6.2* 6.9* 8.2*  PROT 6.2* 5.8* 6.1*  ALBUMIN 1.7* 1.6* 1.7*   Recent Labs  Lab 02/14/2020 1212  LIPASE 34   No results for input(s): AMMONIA in the last 168 hours.  CBC: Recent Labs  Lab 01/25/2020 0458 02/09/20 0720 02/11/20 0546  WBC 6.2 5.5 5.3  HGB 8.8* 8.6* 8.6*  HCT 27.5* 27.5* 27.1*  MCV  89.9 91.1 89.1  PLT 366 330 283    Cardiac Enzymes: No results for input(s): CKTOTAL, CKMB, CKMBINDEX, TROPONINI in the last 168 hours.  BNP (last 3 results) No results for input(s): BNP in the last 8760 hours.  ProBNP (last 3 results) No results for input(s): PROBNP in the last 8760 hours.  Radiological Exams: No results found.  Assessment/Plan Active Problems:   Acute on chronic respiratory failure with hypoxia (HCC)   C3 spinal cord injury, sequela (HCC)   Aspiration pneumonia due to gastric secretions (HCC)   Increased tracheal secretions   Tracheostomy status (HCC)   Elevated bilirubin   1. Acute on chronic respiratory failure with hypoxia plan is to continue with the T collar at this time not ready for capping.  Patient has been having issues with GI and GI is actually following the patient for ERCP 2. C3 spinal cord injury no change supportive care 3. Aspiration pneumonia has been treated clinically improved 4. Retained secretions slow improvement we will continue to monitor 5. Tracheostomy as mentioned will remain in place 6. Jaundice elevated bilirubin seen by nephrology had procedure done procedure note reviewed   I have personally seen and evaluated the patient, evaluated laboratory and imaging results, formulated the assessment and plan and placed orders. The Patient requires high  complexity decision making with multiple systems involvement.  Rounds were done with the Respiratory Therapy Director and Staff therapists and discussed with nursing staff also.  Allyne Gee, MD [facility] Pulmonary Critical Care Medicine Sleep Medicine

## 2020-02-14 DIAGNOSIS — J690 Pneumonitis due to inhalation of food and vomit: Secondary | ICD-10-CM | POA: Diagnosis not present

## 2020-02-14 DIAGNOSIS — J9621 Acute and chronic respiratory failure with hypoxia: Secondary | ICD-10-CM | POA: Diagnosis not present

## 2020-02-14 DIAGNOSIS — J398 Other specified diseases of upper respiratory tract: Secondary | ICD-10-CM | POA: Diagnosis not present

## 2020-02-14 DIAGNOSIS — S14103S Unspecified injury at C3 level of cervical spinal cord, sequela: Secondary | ICD-10-CM | POA: Diagnosis not present

## 2020-02-14 DIAGNOSIS — R748 Abnormal levels of other serum enzymes: Secondary | ICD-10-CM

## 2020-02-14 LAB — HEPATIC FUNCTION PANEL
ALT: 56 U/L — ABNORMAL HIGH (ref 0–44)
AST: 54 U/L — ABNORMAL HIGH (ref 15–41)
Albumin: 1.9 g/dL — ABNORMAL LOW (ref 3.5–5.0)
Alkaline Phosphatase: 478 U/L — ABNORMAL HIGH (ref 38–126)
Bilirubin, Direct: 5.8 mg/dL — ABNORMAL HIGH (ref 0.0–0.2)
Indirect Bilirubin: 2.7 mg/dL — ABNORMAL HIGH (ref 0.3–0.9)
Total Bilirubin: 8.5 mg/dL — ABNORMAL HIGH (ref 0.3–1.2)
Total Protein: 6.4 g/dL — ABNORMAL LOW (ref 6.5–8.1)

## 2020-02-14 LAB — ANTINUCLEAR ANTIBODIES, IFA: ANA Ab, IFA: NEGATIVE

## 2020-02-14 NOTE — Progress Notes (Signed)
Pulmonary Critical Care Medicine [facility] [place]   PULMONARY CRITICAL CARE SERVICE  PROGRESS NOTE  Date of Service: 02/14/2020  Anthony Andersen  ZTI:[id number]  DOB: 1941/09/12   DOA: 02/13/2020  Referring Physician: Carron Curie, MD  HPI: Anthony Andersen is a 79 y.o. male seen for follow up of Acute on Chronic Respiratory Failure.  Patient is on T collar comfortable without distress.  GI work-up is underway for the elevated enzymes  Medications: Reviewed on Rounds  Physical Exam:  Vitals: Temperature 97.5 pulse 87 respiratory 28 blood pressure is 105/61 saturations 98%  Ventilator Settings on T collar with an FiO2 of 28%  . General: Comfortable at this time . Eyes: Grossly normal lids, irises & conjunctiva . ENT: grossly tongue is normal . Neck: no obvious mass . Cardiovascular: S1 S2 normal no gallop . Respiratory: No rhonchi no rales are noted at this time . Abdomen: soft . Skin: no rash seen on limited exam . Musculoskeletal: not rigid . Psychiatric:unable to assess . Neurologic: no seizure no involuntary movements         Lab Data:   Basic Metabolic Panel: Recent Labs  Lab 02/09/20 0720 02/11/20 0546  NA 140 138  K 3.8 3.7  CL 102 101  CO2 30 28  GLUCOSE 75 150*  BUN 25* 26*  CREATININE 0.56* 0.53*  CALCIUM 8.2* 8.2*    ABG: No results for input(s): PHART, PCO2ART, PO2ART, HCO3, O2SAT in the last 168 hours.  Liver Function Tests: Recent Labs  Lab 02/09/20 0720 02/11/20 0546 02/14/20 0726  AST 60* 53* 54*  ALT 77* 65* 56*  ALKPHOS 457* 482* 478*  BILITOT 6.9* 8.2* 8.5*  PROT 5.8* 6.1* 6.4*  ALBUMIN 1.6* 1.7* 1.9*   No results for input(s): LIPASE, AMYLASE in the last 168 hours. No results for input(s): AMMONIA in the last 168 hours.  CBC: Recent Labs  Lab 02/09/20 0720 02/11/20 0546  WBC 5.5 5.3  HGB 8.6* 8.6*  HCT 27.5* 27.1*  MCV 91.1 89.1  PLT 330 283    Cardiac Enzymes: No results for input(s): CKTOTAL, CKMB,  CKMBINDEX, TROPONINI in the last 168 hours.  BNP (last 3 results) No results for input(s): BNP in the last 8760 hours.  ProBNP (last 3 results) No results for input(s): PROBNP in the last 8760 hours.  Radiological Exams: No results found.  Assessment/Plan Active Problems:   Acute on chronic respiratory failure with hypoxia (HCC)   C3 spinal cord injury, sequela (HCC)   Aspiration pneumonia due to gastric secretions (HCC)   Increased tracheal secretions   Tracheostomy status (HCC)   Elevated bilirubin   1. Acute on chronic respiratory failure hypoxia plan is to continue with T collar trials titrate oxygen continue pulmonary toilet 2. C3 spinal cord injury no change we will continue with supportive care 3. Aspiration pneumonia treated continue to follow 4. Retained secretions continue aggressive pulmonary toilet 5. Tracheostomy remains in place at this time no plan on weaning right now 6. Elevated bilirubin being worked up by the GI team   I have personally seen and evaluated the patient, evaluated laboratory and imaging results, formulated the assessment and plan and placed orders. The Patient requires high complexity decision making with multiple systems involvement.  Rounds were done with the Respiratory Therapy Director and Staff therapists and discussed with nursing staff also.  Yevonne Pax, MD [facility] Pulmonary Critical Care Medicine Sleep Medicine

## 2020-02-14 NOTE — Progress Notes (Addendum)
          Daily Rounding Note  02/14/2020, 9:17 AM  LOS: 0 days   SUBJECTIVE:   Chief complaint: elevated LFTs  No abd pain or nausea.    OBJECTIVE:         Vital signs in last 24 hours:     90 8.69F.   HR 80.   Resps 14.  Sats 99%.  127/67.      There were no vitals filed for this visit. General: Jaundiced. ENT: Trach collar in place. Heart: RRR Chest: Nonlabored breathing, diminished breath sounds but clear Abdomen: Soft, nontender, nondistended.  G-tube in place. Sacrum: Large pressure dressing in place.  Looked at photographs of sacral skin breakdown.  These are not deep or suggestive of possible osteomyelitis. Extremities: No CCE. Neuro/Psych: Alert.  Oriented x3.  Intake/Output from previous day: No intake/output data recorded.  Intake/Output this shift: No intake/output data recorded.  Lab Results: No results for input(s): WBC, HGB, HCT, PLT in the last 72 hours. BMET No results for input(s): NA, K, CL, CO2, GLUCOSE, BUN, CREATININE, CALCIUM in the last 72 hours. LFT Recent Labs    02/14/20 0726  PROT 6.4*  ALBUMIN 1.9*  AST 54*  ALT 56*  ALKPHOS 478*  BILITOT 8.5*  BILIDIR 5.8*  IBILI 2.7*    Studies/Results: No results found.   Meds: Humalog and Lantus insulin ASA 81 mg Baclofen Diltiazem Gabapentin Lovenox subcu.   Magnesium oxide per tube MiraLAX Prostat protein supplement. Senna Tube feedings Juven nutritional support.    ASSESMENT:   *   Unexplained elevation of LFTs, primarily alk phos, lesser rise T bili and transaminases.   5/16 ultrasound: uncomplicated GB stones 7/06 MRCP: gallstones, GB mildly distended but no wall thickening.  Bile ducts WNL.   EUS 5/22: slight CBD wall thickening but no CBD stones, CBD not dilated.  Acute hepatitis panel all non-reactive.   A1AT elevated at 230 Smooth muscle Ab 4, normal Mitochondrial Ab IgG <20, normal.   ANA pndg.   Ceruloplasmin 19,  normal.   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  S/p laminectomy, fusion.   ? is there any chance of osteomyelitis in spine which could cause the rise of alk phos?  However normally osteo does not cause rise in bili and WBCs consistently wnl.    *   Dysphagia, feeding via NGT placed 5/12  *   Stable [place] anemia.    *    Respiratory failure.  Tracheostomy collar in place, weaned off vent.  Aspiration pneumonia  PLAN   *   Per Dr Rush Landmark.    Fractionate alk phos.      Azucena Freed  02/14/2020, 9:17 AM Phone [phone]

## 2020-02-15 DIAGNOSIS — S14103S Unspecified injury at C3 level of cervical spinal cord, sequela: Secondary | ICD-10-CM | POA: Diagnosis not present

## 2020-02-15 DIAGNOSIS — J398 Other specified diseases of upper respiratory tract: Secondary | ICD-10-CM | POA: Diagnosis not present

## 2020-02-15 DIAGNOSIS — J9621 Acute and chronic respiratory failure with hypoxia: Secondary | ICD-10-CM | POA: Diagnosis not present

## 2020-02-15 DIAGNOSIS — J690 Pneumonitis due to inhalation of food and vomit: Secondary | ICD-10-CM | POA: Diagnosis not present

## 2020-02-15 LAB — HEPATIC FUNCTION PANEL
ALT: 59 U/L — ABNORMAL HIGH (ref 0–44)
AST: 53 U/L — ABNORMAL HIGH (ref 15–41)
Albumin: 1.9 g/dL — ABNORMAL LOW (ref 3.5–5.0)
Alkaline Phosphatase: 468 U/L — ABNORMAL HIGH (ref 38–126)
Bilirubin, Direct: 5.8 mg/dL — ABNORMAL HIGH (ref 0.0–0.2)
Indirect Bilirubin: 2.5 mg/dL — ABNORMAL HIGH (ref 0.3–0.9)
Total Bilirubin: 8.3 mg/dL — ABNORMAL HIGH (ref 0.3–1.2)
Total Protein: 6.5 g/dL (ref 6.5–8.1)

## 2020-02-15 LAB — CK: Total CK: 21 U/L — ABNORMAL LOW (ref 49–397)

## 2020-02-15 NOTE — Progress Notes (Signed)
Pulmonary Critical Care Medicine [facility] [place]   PULMONARY CRITICAL CARE SERVICE  PROGRESS NOTE  Date of Service: 02/15/2020  Anthony Andersen  TLX:[id number]  DOB: 19-Feb-1941   DOA: 02/11/2020  Referring Physician: Carron Curie, MD  HPI: Anthony Andersen is a 79 y.o. male seen for follow up of Acute on Chronic Respiratory Failure.  Patient currently is on T collar has been on 20% FiO2 still with copious secretions noted  Medications: Reviewed on Rounds  Physical Exam:  Vitals: Temperature 98.4 pulse 78 respiratory 20 blood pressure is 124/68 saturations 98%  Ventilator Settings off ventilator on T collar  . General: Comfortable at this time . Eyes: Grossly normal lids, irises & conjunctiva . ENT: grossly tongue is normal . Neck: no obvious mass . Cardiovascular: S1 S2 normal no gallop . Respiratory: No rhonchi coarse breath sounds . Abdomen: soft . Skin: no rash seen on limited exam . Musculoskeletal: not rigid . Psychiatric:unable to assess . Neurologic: no seizure no involuntary movements         Lab Data:   Basic Metabolic Panel: Recent Labs  Lab 02/09/20 0720 02/11/20 0546  NA 140 138  K 3.8 3.7  CL 102 101  CO2 30 28  GLUCOSE 75 150*  BUN 25* 26*  CREATININE 0.56* 0.53*  CALCIUM 8.2* 8.2*    ABG: No results for input(s): PHART, PCO2ART, PO2ART, HCO3, O2SAT in the last 168 hours.  Liver Function Tests: Recent Labs  Lab 02/09/20 0720 02/11/20 0546 02/14/20 0726 02/15/20 0817  AST 60* 53* 54* 53*  ALT 77* 65* 56* 59*  ALKPHOS 457* 482* 478* 468*  BILITOT 6.9* 8.2* 8.5* 8.3*  PROT 5.8* 6.1* 6.4* 6.5  ALBUMIN 1.6* 1.7* 1.9* 1.9*   No results for input(s): LIPASE, AMYLASE in the last 168 hours. No results for input(s): AMMONIA in the last 168 hours.  CBC: Recent Labs  Lab 02/09/20 0720 02/11/20 0546  WBC 5.5 5.3  HGB 8.6* 8.6*  HCT 27.5* 27.1*  MCV 91.1 89.1  PLT 330 283    Cardiac Enzymes: Recent Labs  Lab 02/15/20 0817   CKTOTAL 21*    BNP (last 3 results) No results for input(s): BNP in the last 8760 hours.  ProBNP (last 3 results) No results for input(s): PROBNP in the last 8760 hours.  Radiological Exams: No results found.  Assessment/Plan Active Problems:   Acute on chronic respiratory failure with hypoxia (HCC)   C3 spinal cord injury, sequela (HCC)   Aspiration pneumonia due to gastric secretions (HCC)   Increased tracheal secretions   Tracheostomy status (HCC)   Elevated bilirubin   1. Acute on chronic respiratory failure hypoxia we will continue with T-piece as tolerated continue aggressive pulmonary toilet 2. C3 spinal cord injury no change supportive care 3. Aspiration pneumonia is clinically improving 4. Patient still has retained tracheal secretions needs ongoing pulmonary toilet 5. Tracheostomy in place with   I have personally seen and evaluated the patient, evaluated laboratory and imaging results, formulated the assessment and plan and placed orders. The Patient requires high complexity decision making with multiple systems involvement.  Rounds were done with the Respiratory Therapy Director and Staff therapists and discussed with nursing staff also.  Yevonne Pax, MD [facility] Pulmonary Critical Care Medicine Sleep Medicine

## 2020-02-16 DIAGNOSIS — S14103S Unspecified injury at C3 level of cervical spinal cord, sequela: Secondary | ICD-10-CM | POA: Diagnosis not present

## 2020-02-16 DIAGNOSIS — J9621 Acute and chronic respiratory failure with hypoxia: Secondary | ICD-10-CM | POA: Diagnosis not present

## 2020-02-16 DIAGNOSIS — R945 Abnormal results of liver function studies: Secondary | ICD-10-CM

## 2020-02-16 DIAGNOSIS — J690 Pneumonitis due to inhalation of food and vomit: Secondary | ICD-10-CM | POA: Diagnosis not present

## 2020-02-16 DIAGNOSIS — J398 Other specified diseases of upper respiratory tract: Secondary | ICD-10-CM | POA: Diagnosis not present

## 2020-02-16 LAB — COMPREHENSIVE METABOLIC PANEL
ALT: 57 U/L — ABNORMAL HIGH (ref 0–44)
AST: 58 U/L — ABNORMAL HIGH (ref 15–41)
Albumin: 2 g/dL — ABNORMAL LOW (ref 3.5–5.0)
Alkaline Phosphatase: 516 U/L — ABNORMAL HIGH (ref 38–126)
Anion gap: 12 (ref 5–15)
BUN: 32 mg/dL — ABNORMAL HIGH (ref 8–23)
CO2: 29 mmol/L (ref 22–32)
Calcium: 8.9 mg/dL (ref 8.9–10.3)
Chloride: 96 mmol/L — ABNORMAL LOW (ref 98–111)
Creatinine, Ser: 0.58 mg/dL — ABNORMAL LOW (ref 0.61–1.24)
GFR calc Af Amer: >60 mL/min (ref >60)
GFR calc non Af Amer: >60 mL/min (ref >60)
Glucose, Bld: 146 mg/dL — ABNORMAL HIGH (ref 70–99)
Potassium: 4.1 mmol/L (ref 3.5–5.1)
Sodium: 137 mmol/L (ref 135–145)
Total Bilirubin: 8.7 mg/dL — ABNORMAL HIGH (ref 0.3–1.2)
Total Protein: 6.7 g/dL (ref 6.5–8.1)

## 2020-02-16 LAB — CBC
HCT: 27.9 % — ABNORMAL LOW (ref 39.0–52.0)
Hemoglobin: 8.8 g/dL — ABNORMAL LOW (ref 13.0–17.0)
MCH: 27.9 pg (ref 26.0–34.0)
MCHC: 31.5 g/dL (ref 30.0–36.0)
MCV: 88.6 fL (ref 80.0–100.0)
Platelets: 379 10*3/uL (ref 150–400)
RBC: 3.15 MIL/uL — ABNORMAL LOW (ref 4.22–5.81)
RDW: 19.4 % — ABNORMAL HIGH (ref 11.5–15.5)
WBC: 7.6 10*3/uL (ref 4.0–10.5)
nRBC: 0 % (ref 0.0–0.2)

## 2020-02-16 LAB — PROTIME-INR
INR: 1.2 (ref 0.8–1.2)
Prothrombin Time: 14.6 seconds (ref 11.4–15.2)

## 2020-02-16 NOTE — Progress Notes (Signed)
Pulmonary Critical Care Medicine [facility] [place]   PULMONARY CRITICAL CARE SERVICE  PROGRESS NOTE  Date of Service: 02/16/2020  SIVAN QUAST  SVX:[id number]  DOB: 1941/08/22   DOA: 02/11/2020  Referring Physician: Carron Curie, MD  HPI: Anthony Andersen is a 79 y.o. male seen for follow up of Acute on Chronic Respiratory Failure.  Patient currently is on T collar has been on 20% FiO2 with excellent saturations.  GI is still working up issues regarding the elevated liver functions.  They are now considering possibly doing a liver biopsy  Medications: Reviewed on Rounds  Physical Exam:  Vitals: Temperature 97.2 pulse 80 respiratory 24 blood pressure is 116/60 saturations 96%  Ventilator Settings off the ventilator on T collar  . General: Comfortable at this time . Eyes: Grossly normal lids, irises & conjunctiva . ENT: grossly tongue is normal . Neck: no obvious mass . Cardiovascular: S1 S2 normal no gallop . Respiratory: No rhonchi coarse breath sounds . Abdomen: soft . Skin: no rash seen on limited exam . Musculoskeletal: not rigid . Psychiatric:unable to assess . Neurologic: no seizure no involuntary movements         Lab Data:   Basic Metabolic Panel: Recent Labs  Lab 02/11/20 0546 02/16/20 0733  NA 138 137  K 3.7 4.1  CL 101 96*  CO2 28 29  GLUCOSE 150* 146*  BUN 26* 32*  CREATININE 0.53* 0.58*  CALCIUM 8.2* 8.9    ABG: No results for input(s): PHART, PCO2ART, PO2ART, HCO3, O2SAT in the last 168 hours.  Liver Function Tests: Recent Labs  Lab 02/11/20 0546 02/14/20 0726 02/15/20 0817 02/16/20 0733  AST 53* 54* 53* 58*  ALT 65* 56* 59* 57*  ALKPHOS 482* 478* 468* 516*  BILITOT 8.2* 8.5* 8.3* 8.7*  PROT 6.1* 6.4* 6.5 6.7  ALBUMIN 1.7* 1.9* 1.9* 2.0*   No results for input(s): LIPASE, AMYLASE in the last 168 hours. No results for input(s): AMMONIA in the last 168 hours.  CBC: Recent Labs  Lab 02/11/20 0546 02/16/20 0733  WBC 5.3  7.6  HGB 8.6* 8.8*  HCT 27.1* 27.9*  MCV 89.1 88.6  PLT 283 379    Cardiac Enzymes: Recent Labs  Lab 02/15/20 0817  CKTOTAL 21*    BNP (last 3 results) No results for input(s): BNP in the last 8760 hours.  ProBNP (last 3 results) No results for input(s): PROBNP in the last 8760 hours.  Radiological Exams: No results found.  Assessment/Plan Active Problems:   Acute on chronic respiratory failure with hypoxia (HCC)   C3 spinal cord injury, sequela (HCC)   Aspiration pneumonia due to gastric secretions (HCC)   Increased tracheal secretions   Tracheostomy status (HCC)   Elevated bilirubin   1. Acute on chronic respiratory failure hypoxia we will continue with T collar trials titrate oxygen continue pulmonary toilet.  Not tolerating further weaning at this time 2. C3 spinal cord injury no change continue with supportive care 3. Aspiration pneumonia treated we will continue to follow 4. Retained secretions continue aggressive pulmonary toilet 5. Elevated bilirubin per GI work-up 6. Tracheostomy will remain in place secondary to copious secretions   I have personally seen and evaluated the patient, evaluated laboratory and imaging results, formulated the assessment and plan and placed orders. The Patient requires high complexity decision making with multiple systems involvement.  Rounds were done with the Respiratory Therapy Director and Staff therapists and discussed with nursing staff also.  Yevonne Pax, MD  [facility] Pulmonary Critical Care Medicine Sleep Medicine

## 2020-02-16 NOTE — Progress Notes (Signed)
Progress Note   Subjective  Chief Complaint: Elevated LFTs  Per nursing staff there has been no change in patient.   Objective   General:    white male in NAD jaundiced Heart:  Regular rate and rhythm; no murmurs Lungs: Nonlabored breathing, diminished breath sounds but clear Abdomen:  Soft, nontender and nondistended. Normal bowel sounds.  G-tube in place Extremities:  Without edema. Neurologic:  Alert.  Lab Results: Recent Labs    02/16/20 0733  WBC 7.6  HGB 8.8*  HCT 27.9*  PLT 379   BMET Recent Labs    02/16/20 0733  NA 137  K 4.1  CL 96*  CO2 29  GLUCOSE 146*  BUN 32*  CREATININE 0.58*  CALCIUM 8.9   LFT Recent Labs    02/15/20 0817 02/15/20 0817 02/16/20 0733  PROT 6.5   < > 6.7  ALBUMIN 1.9*   < > 2.0*  AST 53*   < > 58*  ALT 59*   < > 57*  ALKPHOS 468*   < > 516*  BILITOT 8.3*   < > 8.7*  BILIDIR 5.8*  --   --   IBILI 2.5*  --   --    < > = values in this interval not displayed.   PT/INR Recent Labs    02/16/20 0733  LABPROT 14.6  INR 1.2    Assessment / Plan:   Assessment: 1.  Unexplained elevation of LFTs, primarily alk phos: 5/16 ultrasound: uncomplicated GB stones 3/81 MRCP: gallstones, GB mildly distended but no wall thickening.  Bile ducts WNL.   EUS 5/22: slight CBD wall thickening but no CBD stones, CBD not dilated.  Acute hepatitis panel all non-reactive.   A1AT elevated at 230 Smooth muscle Ab 4, normal Mitochondrial Ab IgG <20, normal.   ANA pndg.   Ceruloplasmin 19, normal.   Thought most likely from antibiotic use in the recent past, but ordering liver biopsy to rule out other causes  2.  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  Dysphagia: Feeding via NGT placed 5/12 4.  Respiratory failure: Tracheostomy collar in place  Plan: 1.  Alk phos continues to rise, have consulted with IR for liver biopsy today. Patient is on Lovenox.  2.  Please await further recommendations from Dr. Rush Landmark  We will  continue to follow.   LOS: 0 days   Levin Erp  02/16/2020, 10:40 AM

## 2020-02-16 NOTE — Consult Note (Signed)
Chief Complaint: Patient was seen in consultation today for elevated LFTs/random liver biopsy.  Referring Physician(s): Levin Erp  Supervising Physician: Aletta Edouard  Patient Status: SSH-inpt  History of Present Illness: Anthony Andersen is a 79 y.o. male with a past medical history of hypertension, acute on chronic respiratory failure s/p tracheostomy, aspiration pneumonia, C3 spinal cord injury, prostate cancer, and anxiety. 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 Hospital course complicated by aspiration pneumonia leading to ventilator dependency and tracheostomy. He was transferred and admitted to [facility] 01/15/2020. During this admission, patient noted to have jaundice and elevated LFTs. MR abdomen MRCP was obtained which revealed gallstones and sludge. GI was consulted who recommended IR consultation for possible random liver biopsy to evaluate etiology of elevated LFTs.  IR requested by Ellouise Newer, PA-C for possible image-guided random liver biopsy. Patient laying in bed resting comfortably. He opens eyes to voice and nods appropriately to yes/no questions. Non-verbal- history difficult to obtain due to this. Denies pain in any location. Accompanied by wife and son at bedside- per family patient is usually verbal, however he is "worn out" today.  LD Lovenox SQ 02/15/2020.   Past Medical History:  Diagnosis Date   Acute on chronic respiratory failure with hypoxia (HCC)    Anxiety    "at times"   Arthritis    Aspiration pneumonia due to gastric secretions (HCC)    C3 spinal cord injury, sequela (HCC)    Headache    High cholesterol    History of bronchitis as a child    Hypertension    Increased tracheal secretions    Nocturia    Tracheostomy status ([place])    Urinary frequency    Wears glasses     Past Surgical  History:  Procedure Laterality Date   BACK SURGERY     x2   COLONOSCOPY     ESOPHAGOGASTRODUODENOSCOPY     EUS N/A 02/06/2020   Procedure: FULL UPPER ENDOSCOPIC ULTRASOUND (EUS) RADIAL;  Surgeon: Milus Banister, MD;  Location: [facility] ENDOSCOPY;  Service: Endoscopy;  Laterality: N/A;   EYE SURGERY Bilateral    cataracts   HEMORRHOID SURGERY     IR GASTROSTOMY TUBE MOD SED  02/02/2020   KNEE ARTHROSCOPY Left    LUMBAR LAMINECTOMY/DECOMPRESSION MICRODISCECTOMY N/A 09/06/2015   Procedure: Thorasic nine-eleven DECOMPRESSION;  Surgeon: Melina Schools, MD;  Location: [facility];  Service: Orthopedics;  Laterality: N/A;    Allergies: Patient has no known allergies.  Medications: Prior to Admission medications   Medication Sig Start Date End Date Taking? Authorizing Provider  gabapentin (NEURONTIN) 300 MG capsule Take 300 mg by mouth 3 (three) times daily. 06/16/15   [provider]  methocarbamol (ROBAXIN) 500 MG tablet Take 1 tablet (500 mg total) by mouth 3 (three) times daily as needed for muscle spasms. 09/06/15   Melina Schools, MD  Multiple Vitamins-Minerals (MULTIVITAMIN ADULT PO) Take 1 tablet by mouth daily.    [provider]  ondansetron (ZOFRAN) 4 MG tablet Take 1 tablet (4 mg total) by mouth every 8 (eight) hours as needed for nausea or vomiting. 09/06/15   Melina Schools, MD  oxyCODONE-acetaminophen (PERCOCET) 10-325 MG tablet Take 1 tablet by mouth every 4 (four) hours as needed for pain. 09/06/15   Melina Schools, MD  ZETIA 10 MG tablet Take 10 mg by mouth daily. 05/18/15   [provider]  Family History  Problem Relation Age of Onset   Cancer Mother    Cancer Father    Diabetes Mellitus I Other    Hypertension Other    Cancer Maternal Grandmother    Cancer Paternal Grandfather     Social History   Socioeconomic History   Marital status: Married    Spouse name: Vaughan Basta   Number of children: 1   Years of education: 12    Highest education level: Not on file  Occupational History   Occupation: Self-employed   Tobacco Use   Smoking status: Never Smoker  Substance and Sexual Activity   Alcohol use: Yes    Alcohol/week: 0.0 standard drinks    Comment: Occasional    Drug use: No   Sexual activity: Not on file  Other Topics Concern   Not on file  Social History Narrative   Lives at home with spouse.   Caffeine use: 1 cup coffee every morning   Social Determinants of Health   Financial Resource Strain:    Difficulty of Paying Living Expenses:   Food Insecurity:    Worried About Charity fundraiser in the Last Year:    Arboriculturist in the Last Year:   Transportation Needs:    Film/video editor (Medical):    Lack of Transportation (Non-Medical):   Physical Activity:    Days of Exercise per Week:    Minutes of Exercise per Session:   Stress:    Feeling of Stress :   Social Connections:    Frequency of Communication with Friends and Family:    Frequency of Social Gatherings with Friends and Family:    Attends Religious Services:    Active Member of Clubs or Organizations:    Attends Archivist Meetings:    Marital Status:      Review of Systems: A 12 point ROS discussed and pertinent positives are indicated in the HPI above.  All other systems are negative.  Review of Systems  Unable to perform ROS: Patient nonverbal    Vital Signs: BP 111/68 (BP Location: Left Arm)    Pulse 86    Temp 98.8 F (37.1 C)    Resp 20    SpO2 99%   Physical Exam Vitals and nursing note reviewed.  Constitutional:      General: He is not in acute distress.    Comments: Tracheostomy.  Cardiovascular:     Rate and Rhythm: Normal rate and regular rhythm.     Heart sounds: Normal heart sounds. No murmur.  Pulmonary:     Effort: Pulmonary effort is normal. No respiratory distress.     Breath sounds: Normal breath sounds.     Comments: Tracheostomy. Skin:    General: Skin  is warm and dry.      MD Evaluation Airway: Other (comments)(Tracheostomy) Heart: WNL Abdomen: WNL Chest/ Lungs: WNL ASA  Classification: 3 Mallampati/Airway Score: Three(Tracheostomy)   Imaging: CT ABDOMEN PELVIS WO CONTRAST  Result Date: 01/29/2020 CLINICAL DATA:  Low hemoglobin. Concern for hemorrhage. Spinal cord injury EXAM: CT ABDOMEN AND PELVIS WITHOUT CONTRAST TECHNIQUE: Multidetector CT imaging of the abdomen and pelvis was performed following the standard protocol without IV contrast. COMPARISON:  CT abdomen 01/22/2020 FINDINGS: Lower chest: Dense RIGHT basilar atelectasis and effusion which is similar comparison exam. Hepatobiliary: Gallbladder is distended to 5 cm. There is a stone within the neck of the gallbladder. Findings similar comparison exam. Common bile duct normal caliber. Pancreas: Pancreas is normal. No  ductal dilatation. No pancreatic inflammation. Spleen: Normal spleen Adrenals/urinary tract: Adrenal glands normal. Kidneys normal on nonenhanced CT. Nonobstructing LEFT renal calculus. Ureters normal. Foley catheter within the bladder. Stomach/Bowel: Feeding tube extends through the stomach into the duodenum the level of ligament Treitz. Small bowel normal. Appendix and cecum normal. The colon and rectosigmoid colon are normal. Vascular/Lymphatic: Abdominal aorta is normal caliber with atherosclerotic calcification. There is no retroperitoneal or periportal lymphadenopathy. No pelvic lymphadenopathy. Reproductive: Penile prosthetic noted. Prostate normal. Penile prosthetic reservoirs in the anterior pelvis Other: No retroperitoneal or intraperitoneal hemorrhage present. Musculoskeletal: No aggressive osseous lesion. Flowing osteophytosis. IMPRESSION: 1. No intraperitoneal or retroperitoneal hemorrhage. 2. Dense atelectasis in the RIGHT lower lobe with small effusion unchanged. 3. Distension of the gallbladder likely related to fasting state. 4. Cholelithiasis. 5. Feeding tube  extends to the ligament Treitz. 6. LEFT nonobstructing renal calculus. Electronically Signed   By: Suzy Bouchard M.D.   On: 01/29/2020 05:12   CT ABDOMEN WO CONTRAST  Result Date: 01/22/2020 CLINICAL DATA:  Respiratory failure, spinal cord injury and evaluation for possible percutaneous gastrostomy tube placement for nutrition. EXAM: CT ABDOMEN WITHOUT CONTRAST TECHNIQUE: Multidetector CT imaging of the abdomen was performed following the standard protocol without IV contrast. COMPARISON:  None. FINDINGS: Lower chest: Posterior right lower lobe atelectasis/consolidation and mild atelectasis at the left lung base. Hepatobiliary: No focal liver abnormality is seen. Calcified gallstone is present in the region of the neck of the gallbladder. No biliary dilatation. Pancreas: Unremarkable. No pancreatic ductal dilatation or surrounding inflammatory changes. Spleen: Normal in size without focal abnormality. Adrenals/Urinary Tract: Unenhanced appearance of the adrenal glands is unremarkable. There is a small nonobstructing calculus in the lower pole of the left kidney measuring up to 6 mm. Stomach/Bowel: No hiatal hernia. Feeding tube extends through the stomach and duodenum with the tip located in the proximal jejunum. The stomach is horizontal and fairly high in position, predominantly substernal and subxiphoid when decompressed. The stomach does lie superior to the transverse colon. There is no evidence of visible bowel obstruction, ileus, inflammatory process or free air. Vascular/Lymphatic: No significant vascular findings are present. No enlarged abdominal lymph nodes. Other: No hernias, ascites, focal fluid collections or body wall edema. Musculoskeletal: No acute or significant osseous findings. IMPRESSION: 1. The stomach is horizontal and fairly high in position but does lie superior to the transverse colon. With air insufflation, a window for placement of a percutaneous gastrostomy tube should be present.  2. Posterior right lower lobe atelectasis/consolidation and mild atelectasis at the left lung base. 3. Cholelithiasis. 4. Nonobstructing 6 mm calculus in the lower pole of the left kidney. Electronically Signed   By: Aletta Edouard M.D.   On: 01/22/2020 15:20   IR GASTROSTOMY TUBE MOD SED  Result Date: 02/02/2020 CLINICAL DATA:  Spinal cord injury, needs enteral feeding support EXAM: PERC PLACEMENT GASTROSTOMY FLUOROSCOPY TIME:  1.2 minute; 395  uGym2 DAP TECHNIQUE: The procedure, risks, benefits, and alternatives were explained to the patient. Questions regarding the procedure were encouraged and answered. The patient understands and consents to the procedure. The patient was already receiving adequate prophylactic vancomycin antibiotic coverage. Appropriate safe percutaneous approach was noted on recent CT. A 5 French angiographic catheter was placed as orogastric tube. The upper abdomen was prepped with Betadine, draped in usual sterile fashion, and infiltrated locally with 1% lidocaine. Intravenous Fentanyl 37mg and Versed 165mwere administered as conscious sedation during continuous monitoring of the patient's level of consciousness and physiological / cardiorespiratory status  by the radiology RN, with a total moderate sedation time of 10 minutes. Stomach was insufflated using air through the orogastric tube. An 72 French sheath needle was advanced percutaneously into the gastric lumen under fluoroscopy. Gas could be aspirated and a small contrast injection confirmed intraluminal spread. The sheath was exchanged over a guidewire for a 9 [place] vascular sheath, through which the snare device was advanced and used to snare a guidewire passed through the orogastric tube. This was withdrawn, and the snare attached to the 20 French pull-through gastrostomy tube, which was advanced antegrade, positioned with the internal bumper securing the anterior gastric wall to the anterior abdominal wall. Small contrast  injection confirms appropriate positioning. The external bumper was applied and the catheter was flushed. COMPLICATIONS: COMPLICATIONS none IMPRESSION: 1. Technically successful 20 French pull-through gastrostomy placement under fluoroscopy. Electronically Signed   By: Lucrezia Europe M.D.   On: 02/02/2020 15:20   DG CHEST PORT 1 VIEW  Result Date: 02/11/2020 CLINICAL DATA:  Fever. EXAM: PORTABLE CHEST 1 VIEW COMPARISON:  01/25/2020 FINDINGS: The cardiac silhouette, mediastinal and hilar contours are stable. The tracheostomy tube is stable. Persistent right lung airspace process and pleural effusion. The left lung remains relatively clear. IMPRESSION: Persistent right lung airspace process and right pleural effusion. Electronically Signed   By: Marijo Sanes M.D.   On: 02/11/2020 07:01   DG CHEST PORT 1 VIEW  Result Date: 01/25/2020 CLINICAL DATA:  Respiratory distress. EXAM: PORTABLE CHEST 1 VIEW COMPARISON:  CT abdomen pelvis and chest x-ray dated Jan 22, 2020. FINDINGS: Unchanged tracheostomy and feeding tubes. The heart size and mediastinal contours are within normal limits. Insert vascular unchanged elevation of the right hemidiaphragm with continued patchy consolidation in right lower lobe. No pleural effusion or pneumothorax. No acute osseous abnormality. Prior cervicothoracic posterior fusion. IMPRESSION: 1. Persistent right lower lobe pneumonia. Electronically Signed   By: Titus Dubin M.D.   On: 01/25/2020 10:58   DG CHEST PORT 1 VIEW  Result Date: 01/22/2020 CLINICAL DATA:  Pneumonia. EXAM: PORTABLE CHEST 1 VIEW COMPARISON:  01/17/2020 FINDINGS: Tracheostomy tube in adequate position. Enteric tube courses to the stomach and proximal duodenum and off the film as tip is not visualized. Stable moderate elevation of the right hemidiaphragm. Minimal hazy prominence of the right perihilar vessels unchanged and may be due to minimal vascular congestion. Remainder the lungs are clear. Cardiomediastinal  silhouette and remainder of the exam is unchanged. IMPRESSION: Stable elevation of the right hemidiaphragm with mild stable prominence of the right perihilar vessels suggesting minimal vascular congestion. Electronically Signed   By: Marin Olp M.D.   On: 01/22/2020 08:40   MR ABDOMEN MRCP W WO CONTAST  Result Date: 02/09/2020 CLINICAL DATA:  Cholelithiasis, status post percutaneous gastrostomy EXAM: MRI ABDOMEN WITHOUT AND WITH CONTRAST (INCLUDING MRCP) TECHNIQUE: Multiplanar multisequence MR imaging of the abdomen was performed both before and after the administration of intravenous contrast. Heavily T2-weighted images of the biliary and pancreatic ducts were obtained, and three-dimensional MRCP images were rendered by post processing. Per technologist note, patient was unable to follow free breathing commands. CONTRAST:  38m GADAVIST GADOBUTROL 1 MMOL/ML IV SOLN COMPARISON:  CT abdomen pelvis, 01/29/2020, right upper quadrant ultrasound, 02/06/2020 FINDINGS: Lower chest: Moderate right pleural effusion and associated atelectasis or consolidation. Hepatobiliary: No mass or other parenchymal abnormality identified. Mildly distended gallbladder containing multiple gallstones and sludge. No biliary ductal dilatation or choledocholithiasis. No gallbladder wall thickening or pericholecystic fluid. Incidental note is made of a low insertion  of the duct on the common bile duct. Pancreas: No mass, inflammatory changes, or other parenchymal abnormality identified. Spleen:  Within normal limits in size and appearance. Adrenals/Urinary Tract: No masses identified. No evidence of hydronephrosis. Stomach/Bowel: Percutaneous gastrostomy. Visualized portions within the abdomen are otherwise unremarkable. Vascular/Lymphatic: No pathologically enlarged lymph nodes identified. No abdominal aortic aneurysm demonstrated. Other:  None. Musculoskeletal: No suspicious bone lesions identified. IMPRESSION: 1. Mildly distended  gallbladder containing multiple gallstones and sludge. No biliary ductal dilatation or choledocholithiasis. No gallbladder wall thickening or pericholecystic fluid. 2. Moderate right pleural effusion and associated atelectasis or consolidation. 3. Percutaneous gastrostomy. Electronically Signed   By: Eddie Candle M.D.   On: 02/09/2020 16:20   US Abdomen Limited RUQ  Result Date: 02/06/2020 CLINICAL DATA:  Cholelithiasis EXAM: ULTRASOUND ABDOMEN LIMITED RIGHT UPPER QUADRANT COMPARISON:  01/29/2020 FINDINGS: Gallbladder: Gallbladder is well distended with cholelithiasis particularly within the neck of the gallbladder. No wall thickening or pericholecystic fluid is noted. Common bile duct: Diameter: 5 mm Liver: No focal lesion identified. Within normal limits in parenchymal echogenicity. Portal vein is patent on color Doppler imaging with normal direction of blood flow towards the liver. Other: None. IMPRESSION: Cholelithiasis without complicating factors. Electronically Signed   By: Inez Catalina M.D.   On: 02/06/2020 18:55    Labs:  CBC: Recent Labs    02/17/2020 0458 02/09/20 0720 02/11/20 0546 02/16/20 0733  WBC 6.2 5.5 5.3 7.6  HGB 8.8* 8.6* 8.6* 8.8*  HCT 27.5* 27.5* 27.1* 27.9*  PLT 366 330 283 379    COAGS: Recent Labs    02/02/20 0814 02/09/20 0720 02/16/20 0733  INR 1.2 1.2 1.2    BMP: Recent Labs    02/02/2020 0458 02/09/20 0720 02/11/20 0546 02/16/20 0733  NA 138 140 138 137  K 3.7 3.8 3.7 4.1  CL 101 102 101 96*  CO2 _0 GLUCOSE 102* 75 150* 146*  BUN 29* 25* 26* 32*  CALCIUM 8.0* 8.2* 8.2* 8.9  CREATININE 0.44* 0.56* 0.53* 0.58*  GFRNONAA >60 >60 >60 >60  GFRAA >60 >60 >60 >60    LIVER FUNCTION TESTS: Recent Labs    02/11/20 0546 02/14/20 0726 02/15/20 0817 02/16/20 0733  BILITOT 8.2* 8.5* 8.3* 8.7*  AST 53* 54* 53* 58*  ALT 65* 56* 59* 57*  ALKPHOS 482* 478* 468* 516*  PROT 6.1* 6.4* 6.5 6.7  ALBUMIN 1.7* 1.9* 1.9* 2.0*     Assessment  and Plan:  Elevated LFTs (primarily alk phos) without clear etiology. Plan for image-guided random liver lesion biopsy tentatively for tomorrow in IR. Patient will be NPO at midnight. Afebrile. Lovenox held per IR protocol. INR 1.2 today.  Risks and benefits discussed with the patient's wife including, but not limited to bleeding, infection, damage to adjacent structures or low yield requiring additional tests. All of the patient's wife's questions were answered, patient is agreeable to proceed. Consent obtained by patient's wife- signed and in chart.   Thank you for this interesting consult.  I greatly enjoyed meeting Anthony Andersen and look forward to participating in their care.  A copy of this report was sent to the requesting provider on this date.  Electronically Signed: Earley Abide, PA-C 02/16/2020, 1:16 PM   I spent a total of 40 Minutes in face to face in clinical consultation, greater than 50% of which was counseling/coordinating care for elevated LFTs/random liver biopsy.

## 2020-02-17 ENCOUNTER — Other Ambulatory Visit (HOSPITAL_COMMUNITY): Payer: Self-pay

## 2020-02-17 MED ORDER — GELATIN ABSORBABLE 12-7 MM EX MISC
CUTANEOUS | Status: AC
  Filled 2020-02-17: qty 1

## 2020-02-17 MED ORDER — MIDAZOLAM HCL 2 MG/2ML IJ SOLN
INTRAMUSCULAR | Status: AC
  Filled 2020-02-17: qty 2

## 2020-02-17 MED ORDER — LIDOCAINE HCL (PF) 1 % IJ SOLN
INTRAMUSCULAR | Status: AC
  Filled 2020-02-17: qty 30

## 2020-02-17 MED ORDER — FENTANYL CITRATE (PF) 100 MCG/2ML IJ SOLN
INTRAMUSCULAR | Status: AC
  Filled 2020-02-17: qty 2

## 2020-02-17 NOTE — Procedures (Signed)
Interventional Radiology Procedure Note  Procedure: US guided biopsy liver, medical liver.  Complications: None Recommendations:  - bedrest - Do not submerge for 7 days - Routine care   Signed,  Yvone Neu. Loreta Ave, DO

## 2020-02-18 DIAGNOSIS — J690 Pneumonitis due to inhalation of food and vomit: Secondary | ICD-10-CM | POA: Diagnosis not present

## 2020-02-18 DIAGNOSIS — J9621 Acute and chronic respiratory failure with hypoxia: Secondary | ICD-10-CM | POA: Diagnosis not present

## 2020-02-18 DIAGNOSIS — Z930 Tracheostomy status: Secondary | ICD-10-CM | POA: Diagnosis not present

## 2020-02-18 DIAGNOSIS — J398 Other specified diseases of upper respiratory tract: Secondary | ICD-10-CM | POA: Diagnosis not present

## 2020-02-18 LAB — CBC
HCT: 28.1 % — ABNORMAL LOW (ref 39.0–52.0)
Hemoglobin: 9 g/dL — ABNORMAL LOW (ref 13.0–17.0)
MCH: 28.3 pg (ref 26.0–34.0)
MCHC: 32 g/dL (ref 30.0–36.0)
MCV: 88.4 fL (ref 80.0–100.0)
Platelets: 406 10*3/uL — ABNORMAL HIGH (ref 150–400)
RBC: 3.18 MIL/uL — ABNORMAL LOW (ref 4.22–5.81)
RDW: 19.9 % — ABNORMAL HIGH (ref 11.5–15.5)
WBC: 8.4 10*3/uL (ref 4.0–10.5)
nRBC: 0 % (ref 0.0–0.2)

## 2020-02-18 NOTE — Progress Notes (Signed)
Daily Rounding Note  02/18/2020, 10:31 AM  LOS: 0 days   SUBJECTIVE:   Chief complaint:    Jaundice.    OBJECTIVE:         Vital signs in last 24 hours:    Resp:  [13-19] 13 (05/27 1052) BP: (116-122)/(64-65) 116/65 (05/27 1052) SpO2:  [97 %-99 %] 97 % (05/27 1052)   There were no vitals filed for this visit. Not re-examined.    Intake/Output from previous day: No intake/output data recorded.  Intake/Output this shift: No intake/output data recorded.  Lab Results: Recent Labs    02/16/20 0733  WBC 7.6  HGB 8.8*  HCT 27.9*  PLT 379   BMET Recent Labs    02/16/20 0733  NA 137  K 4.1  CL 96*  CO2 29  GLUCOSE 146*  BUN 32*  CREATININE 0.58*  CALCIUM 8.9   LFT Recent Labs    02/16/20 0733  PROT 6.7  ALBUMIN 2.0*  AST 58*  ALT 57*  ALKPHOS 516*  BILITOT 8.7*   PT/INR Recent Labs    02/16/20 0733  LABPROT 14.6  INR 1.2   Hepatitis Panel No results for input(s): HEPBSAG, HCVAB, HEPAIGM, HEPBIGM in the last 72 hours.  Studies/Results: US BIOPSY (LIVER)  Result Date: 02/17/2020 INDICATION: 79 year old male with a history of liver dysfunction referred for medical liver biopsy EXAM: IMAGE GUIDED MEDICAL LIVER BIOPSY MEDICATIONS: None. ANESTHESIA/SEDATION: Moderate (conscious) sedation was employed during this procedure. A total of Versed 0 mg and Fentanyl 0 mcg was administered intravenously. Moderate Sedation Time: 0 minutes. The patient's level of consciousness and vital signs were monitored continuously by radiology nursing throughout the procedure under my direct supervision. FLUOROSCOPY TIME:  None COMPLICATIONS: None PROCEDURE: Informed written consent was obtained from the patient's family after a thorough discussion of the procedural risks, benefits and alternatives. All questions were addressed. Maximal Sterile Barrier Technique was utilized including caps, mask, sterile gowns, sterile  gloves, sterile drape, hand hygiene and skin antiseptic. A timeout was performed prior to the initiation of the procedure. Ultrasound survey of the right liver lobe performed with images stored and sent to PACs. The right lower thorax/right upper abdomen was prepped with chlorhexidine in a sterile fashion, and a sterile drape was applied covering the operative field. A sterile gown and sterile gloves were used for the procedure. Local anesthesia was provided with 1% Lidocaine. Once the patient is prepped and draped sterilely and the skin and subcutaneous tissues were generously infiltrated with 1% lidocaine, a small stab incision was made with an 11 blade scalpel. A 17 gauge introducer needle was advanced under ultrasound guidance in an intercostal location into the right liver lobe. The stylet was removed, and 3 separate 18 gauge core biopsy were retrieved. Samples were placed into formalin for transportation to the lab. Three separate Gel-Foam pledgets were then infused with a small amount of saline for assistance with hemostasis. The needle was removed, and a final ultrasound image was performed. The patient tolerated the procedure well and remained hemodynamically stable throughout. No complications were encountered and no significant blood loss was encounter. IMPRESSION: Status post medical liver biopsy. Signed, Dulcy Fanny. Dellia Nims, RPVI [facility] Radiology Electronically Signed   By: Corrie Mckusick D.O.   On: 02/17/2020 13:08    ASSESMENT:   *   Unexplained jaundice.  ? DILI due to cefepime in use earlier this month.  t bili, Alk  phos rising.  AST minimally rising.   Tests for acute hep ABC, AIH, metabollic causes negative.   S/p 5/29 liver biopsy, awaiting pathology.       PLAN   *   Await liver path report.      Azucena Freed  02/18/2020, 10:31 AM Phone [phone]

## 2020-02-19 DIAGNOSIS — J398 Other specified diseases of upper respiratory tract: Secondary | ICD-10-CM | POA: Diagnosis not present

## 2020-02-19 DIAGNOSIS — J690 Pneumonitis due to inhalation of food and vomit: Secondary | ICD-10-CM | POA: Diagnosis not present

## 2020-02-19 DIAGNOSIS — J9621 Acute and chronic respiratory failure with hypoxia: Secondary | ICD-10-CM | POA: Diagnosis not present

## 2020-02-19 DIAGNOSIS — S14103S Unspecified injury at C3 level of cervical spinal cord, sequela: Secondary | ICD-10-CM | POA: Diagnosis not present

## 2020-02-19 LAB — CBC
HCT: 27.2 % — ABNORMAL LOW (ref 39.0–52.0)
Hemoglobin: 9.1 g/dL — ABNORMAL LOW (ref 13.0–17.0)
MCH: 29.1 pg (ref 26.0–34.0)
MCHC: 33.5 g/dL (ref 30.0–36.0)
MCV: 86.9 fL (ref 80.0–100.0)
Platelets: 471 10*3/uL — ABNORMAL HIGH (ref 150–400)
RBC: 3.13 MIL/uL — ABNORMAL LOW (ref 4.22–5.81)
RDW: 20.7 % — ABNORMAL HIGH (ref 11.5–15.5)
WBC: 7.8 10*3/uL (ref 4.0–10.5)
nRBC: 0 % (ref 0.0–0.2)

## 2020-02-19 LAB — COMPREHENSIVE METABOLIC PANEL
ALT: 57 U/L — ABNORMAL HIGH (ref 0–44)
AST: 65 U/L — ABNORMAL HIGH (ref 15–41)
Albumin: 2.1 g/dL — ABNORMAL LOW (ref 3.5–5.0)
Alkaline Phosphatase: 498 U/L — ABNORMAL HIGH (ref 38–126)
Anion gap: 11 (ref 5–15)
BUN: 27 mg/dL — ABNORMAL HIGH (ref 8–23)
CO2: 27 mmol/L (ref 22–32)
Calcium: 8.8 mg/dL — ABNORMAL LOW (ref 8.9–10.3)
Chloride: 99 mmol/L (ref 98–111)
Creatinine, Ser: 0.48 mg/dL — ABNORMAL LOW (ref 0.61–1.24)
GFR calc Af Amer: >60 mL/min (ref >60)
GFR calc non Af Amer: >60 mL/min (ref >60)
Glucose, Bld: 147 mg/dL — ABNORMAL HIGH (ref 70–99)
Potassium: 4.4 mmol/L (ref 3.5–5.1)
Sodium: 137 mmol/L (ref 135–145)
Total Bilirubin: 11 mg/dL — ABNORMAL HIGH (ref 0.3–1.2)
Total Protein: 7 g/dL (ref 6.5–8.1)

## 2020-02-19 NOTE — Progress Notes (Signed)
Pulmonary Critical Care Medicine [facility] [place]   PULMONARY CRITICAL CARE SERVICE  PROGRESS NOTE  Date of Service: 02/19/2020  Anthony Andersen  YQM:[id number]  DOB: 01-29-1941   DOA: 02/06/2020  Referring Physician: Carron Curie, MD  HPI: Anthony Andersen is a 79 y.o. male seen for follow up of Acute on Chronic Respiratory Failure.  Patient currently is on T collar has been on 20% FiO2 with good saturations  Medications: Reviewed on Rounds  Physical Exam:  Vitals: Temperature is 99.6 pulse 97 respiratory 22 blood pressure is 113/66 saturations 99%  Ventilator Settings on T collar FiO2 28%  . General: Comfortable at this time . Eyes: Grossly normal lids, irises & conjunctiva . ENT: grossly tongue is normal . Neck: no obvious mass . Cardiovascular: S1 S2 normal no gallop . Respiratory: No rhonchi no rales noted at this time . Abdomen: soft . Skin: no rash seen on limited exam . Musculoskeletal: not rigid . Psychiatric:unable to assess . Neurologic: no seizure no involuntary movements         Lab Data:   Basic Metabolic Panel: Recent Labs  Lab 02/16/20 0733  NA 137  K 4.1  CL 96*  CO2 29  GLUCOSE 146*  BUN 32*  CREATININE 0.58*  CALCIUM 8.9    ABG: No results for input(s): PHART, PCO2ART, PO2ART, HCO3, O2SAT in the last 168 hours.  Liver Function Tests: Recent Labs  Lab 02/14/20 0726 02/15/20 0817 02/16/20 0733  AST 54* 53* 58*  ALT 56* 59* 57*  ALKPHOS 478* 468* 516*  BILITOT 8.5* 8.3* 8.7*  PROT 6.4* 6.5 6.7  ALBUMIN 1.9* 1.9* 2.0*   No results for input(s): LIPASE, AMYLASE in the last 168 hours. No results for input(s): AMMONIA in the last 168 hours.  CBC: Recent Labs  Lab 02/16/20 0733 02/18/20 1105 02/19/20 1020  WBC 7.6 8.4 7.8  HGB 8.8* 9.0* 9.1*  HCT 27.9* 28.1* 27.2*  MCV 88.6 88.4 86.9  PLT 379 406* 471*    Cardiac Enzymes: Recent Labs  Lab 02/15/20 0817  CKTOTAL 21*    BNP (last 3 results) No results for  input(s): BNP in the last 8760 hours.  ProBNP (last 3 results) No results for input(s): PROBNP in the last 8760 hours.  Radiological Exams: No results found.  Assessment/Plan Active Problems:   Acute on chronic respiratory failure with hypoxia (HCC)   C3 spinal cord injury, sequela (HCC)   Aspiration pneumonia due to gastric secretions (HCC)   Increased tracheal secretions   Tracheostomy status (HCC)   Elevated bilirubin   1. Acute on chronic respiratory failure hypoxia we will continue with T collar trials titrate oxygen continue pulmonary toilet. 2. C3 spinal cord injury no change we will continue to follow 3. Aspiration pneumonia treated continue with supportive care 4. Retained tracheal secretions at baseline we will continue to follow 5. Tracheostomy remains in place 6. Elevated bilirubin no change supportive care   I have personally seen and evaluated the patient, evaluated laboratory and imaging results, formulated the assessment and plan and placed orders. The Patient requires high complexity decision making with multiple systems involvement.  Rounds were done with the Respiratory Therapy Director and Staff therapists and discussed with nursing staff also.  Yevonne Pax, MD [facility] Pulmonary Critical Care Medicine Sleep Medicine

## 2020-02-20 DIAGNOSIS — J398 Other specified diseases of upper respiratory tract: Secondary | ICD-10-CM | POA: Diagnosis not present

## 2020-02-20 DIAGNOSIS — J9621 Acute and chronic respiratory failure with hypoxia: Secondary | ICD-10-CM | POA: Diagnosis not present

## 2020-02-20 DIAGNOSIS — S14103S Unspecified injury at C3 level of cervical spinal cord, sequela: Secondary | ICD-10-CM | POA: Diagnosis not present

## 2020-02-20 DIAGNOSIS — J690 Pneumonitis due to inhalation of food and vomit: Secondary | ICD-10-CM | POA: Diagnosis not present

## 2020-02-20 NOTE — Progress Notes (Signed)
Pulmonary Critical Care Medicine [facility] [place]   PULMONARY CRITICAL CARE SERVICE  PROGRESS NOTE  Date of Service: 02/20/2020  Anthony Andersen  CVE:[id number]  DOB: 1941-03-24   DOA: 02/27/2020  Referring Physician: Carron Curie, MD  HPI: Anthony Andersen is a 79 y.o. male seen for follow up of Acute on Chronic Respiratory Failure.  Patient is comfortable right now without distress remains on T collar.  Has been on 28% FiO2 with the PMV in place  Medications: Reviewed on Rounds  Physical Exam:  Vitals: Temperature is 98.1 pulse 92 respiratory rate 15 blood pressure is 130/72 saturations 98%  Ventilator Settings on T collar with an FiO2 of 28% with the PMV  . General: Comfortable at this time . Eyes: Grossly normal lids, irises & conjunctiva . ENT: grossly tongue is normal . Neck: no obvious mass . Cardiovascular: S1 S2 normal no gallop . Respiratory: Scattered rhonchi coarse breath sounds . Abdomen: soft . Skin: no rash seen on limited exam . Musculoskeletal: not rigid . Psychiatric:unable to assess . Neurologic: no seizure no involuntary movements         Lab Data:   Basic Metabolic Panel: Recent Labs  Lab 02/16/20 0733 02/19/20 1020  NA 137 137  K 4.1 4.4  CL 96* 99  CO2 29 27  GLUCOSE 146* 147*  BUN 32* 27*  CREATININE 0.58* 0.48*  CALCIUM 8.9 8.8*    ABG: No results for input(s): PHART, PCO2ART, PO2ART, HCO3, O2SAT in the last 168 hours.  Liver Function Tests: Recent Labs  Lab 02/14/20 0726 02/15/20 0817 02/16/20 0733 02/19/20 1020  AST 54* 53* 58* 65*  ALT 56* 59* 57* 57*  ALKPHOS 478* 468* 516* 498*  BILITOT 8.5* 8.3* 8.7* 11.0*  PROT 6.4* 6.5 6.7 7.0  ALBUMIN 1.9* 1.9* 2.0* 2.1*   No results for input(s): LIPASE, AMYLASE in the last 168 hours. No results for input(s): AMMONIA in the last 168 hours.  CBC: Recent Labs  Lab 02/16/20 0733 02/18/20 1105 02/19/20 1020  WBC 7.6 8.4 7.8  HGB 8.8* 9.0* 9.1*  HCT 27.9* 28.1* 27.2*   MCV 88.6 88.4 86.9  PLT 379 406* 471*    Cardiac Enzymes: Recent Labs  Lab 02/15/20 0817  CKTOTAL 21*    BNP (last 3 results) No results for input(s): BNP in the last 8760 hours.  ProBNP (last 3 results) No results for input(s): PROBNP in the last 8760 hours.  Radiological Exams: No results found.  Assessment/Plan Active Problems:   Acute on chronic respiratory failure with hypoxia (HCC)   C3 spinal cord injury, sequela (HCC)   Aspiration pneumonia due to gastric secretions (HCC)   Increased tracheal secretions   Tracheostomy status (HCC)   Elevated bilirubin   1. Acute on chronic respiratory failure with hypoxia patient currently is on T collar with PMV currently on 28% FiO2 2. C3 spinal cord injury no change supportive care 3. Aspiration pneumonia at baseline we will continue present management 4. Retained secretions continue aggressive pulmonary toilet 5. Tracheostomy remains in place we will continue with supportive care   I have personally seen and evaluated the patient, evaluated laboratory and imaging results, formulated the assessment and plan and placed orders. The Patient requires high complexity decision making with multiple systems involvement.  Rounds were done with the Respiratory Therapy Director and Staff therapists and discussed with nursing staff also.  Yevonne Pax, MD [facility] Pulmonary Critical Care Medicine Sleep Medicine

## 2020-02-21 DIAGNOSIS — S14103S Unspecified injury at C3 level of cervical spinal cord, sequela: Secondary | ICD-10-CM | POA: Diagnosis not present

## 2020-02-21 DIAGNOSIS — J398 Other specified diseases of upper respiratory tract: Secondary | ICD-10-CM | POA: Diagnosis not present

## 2020-02-21 DIAGNOSIS — J690 Pneumonitis due to inhalation of food and vomit: Secondary | ICD-10-CM | POA: Diagnosis not present

## 2020-02-21 DIAGNOSIS — J9621 Acute and chronic respiratory failure with hypoxia: Secondary | ICD-10-CM | POA: Diagnosis not present

## 2020-02-21 LAB — PROTIME-INR
INR: 1.3 — ABNORMAL HIGH (ref 0.8–1.2)
Prothrombin Time: 15.6 seconds — ABNORMAL HIGH (ref 11.4–15.2)

## 2020-02-21 LAB — HEPATIC FUNCTION PANEL
ALT: 55 U/L — ABNORMAL HIGH (ref 0–44)
AST: 64 U/L — ABNORMAL HIGH (ref 15–41)
Albumin: 2 g/dL — ABNORMAL LOW (ref 3.5–5.0)
Alkaline Phosphatase: 497 U/L — ABNORMAL HIGH (ref 38–126)
Bilirubin, Direct: 7.2 mg/dL — ABNORMAL HIGH (ref 0.0–0.2)
Indirect Bilirubin: 3.8 mg/dL — ABNORMAL HIGH (ref 0.3–0.9)
Total Bilirubin: 11 mg/dL — ABNORMAL HIGH (ref 0.3–1.2)
Total Protein: 7 g/dL (ref 6.5–8.1)

## 2020-02-21 NOTE — Progress Notes (Signed)
Pulmonary Critical Care Medicine [facility] [place]   PULMONARY CRITICAL CARE SERVICE  PROGRESS NOTE  Date of Service: 02/21/2020  Anthony Andersen  [id number]:[id number]  DOB: July 21, 1941   DOA: 01/22/2020  Referring Physician: Carron Curie, MD  HPI: Anthony Andersen is a 79 y.o. male seen for follow up of Acute on Chronic Respiratory Failure.  Patient currently is on T collar has been on 28% FiO2 using PMV also  Medications: Reviewed on Rounds  Physical Exam:  Vitals: Temperature is 97.9 pulse 92 respiratory 23 blood pressure is 134/61 saturations 97%  Ventilator Settings on T collar with an FiO2 of 28% using PMV  . General: Comfortable at this time . Eyes: Grossly normal lids, irises & conjunctiva . ENT: grossly tongue is normal . Neck: no obvious mass . Cardiovascular: S1 S2 normal no gallop . Respiratory: No rhonchi no rales . Abdomen: soft . Skin: no rash seen on limited exam . Musculoskeletal: not rigid . Psychiatric:unable to assess . Neurologic: no seizure no involuntary movements         Lab Data:   Basic Metabolic Panel: Recent Labs  Lab 02/16/20 0733 02/19/20 1020  NA 137 137  K 4.1 4.4  CL 96* 99  CO2 29 27  GLUCOSE 146* 147*  BUN 32* 27*  CREATININE 0.58* 0.48*  CALCIUM 8.9 8.8*    ABG: No results for input(s): PHART, PCO2ART, PO2ART, HCO3, O2SAT in the last 168 hours.  Liver Function Tests: Recent Labs  Lab 02/15/20 0817 02/16/20 0733 02/19/20 1020  AST 53* 58* 65*  ALT 59* 57* 57*  ALKPHOS 468* 516* 498*  BILITOT 8.3* 8.7* 11.0*  PROT 6.5 6.7 7.0  ALBUMIN 1.9* 2.0* 2.1*   No results for input(s): LIPASE, AMYLASE in the last 168 hours. No results for input(s): AMMONIA in the last 168 hours.  CBC: Recent Labs  Lab 02/16/20 0733 02/18/20 1105 02/19/20 1020  WBC 7.6 8.4 7.8  HGB 8.8* 9.0* 9.1*  HCT 27.9* 28.1* 27.2*  MCV 88.6 88.4 86.9  PLT 379 406* 471*    Cardiac Enzymes: Recent Labs  Lab 02/15/20 0817  CKTOTAL 21*     BNP (last 3 results) No results for input(s): BNP in the last 8760 hours.  ProBNP (last 3 results) No results for input(s): PROBNP in the last 8760 hours.  Radiological Exams: No results found.  Assessment/Plan Active Problems:   Acute on chronic respiratory failure with hypoxia (HCC)   C3 spinal cord injury, sequela (HCC)   Aspiration pneumonia due to gastric secretions (HCC)   Increased tracheal secretions   Tracheostomy status (HCC)   Elevated bilirubin   1. Acute on chronic respiratory failure hypoxia continue with T trials titrate oxygen continue pulmonary toilet.  Patient is doing well with PMV which will be continued. 2. Aspiration pneumonia patient still has significant issues with GI which is being worked up 3. Retained secretions continue aggressive pulmonary toilet 4. Elevated bilirubin being worked up by GI 5. Tracheostomy will remain in place at this time 6. C3 spinal cord injury no change   I have personally seen and evaluated the patient, evaluated laboratory and imaging results, formulated the assessment and plan and placed orders. The Patient requires high complexity decision making with multiple systems involvement.  Rounds were done with the Respiratory Therapy Director and Staff therapists and discussed with nursing staff also.  Yevonne Pax, MD [facility] Pulmonary Critical Care Medicine Sleep Medicine

## 2020-02-22 DIAGNOSIS — J9621 Acute and chronic respiratory failure with hypoxia: Secondary | ICD-10-CM | POA: Diagnosis not present

## 2020-02-22 DIAGNOSIS — J398 Other specified diseases of upper respiratory tract: Secondary | ICD-10-CM | POA: Diagnosis not present

## 2020-02-22 DIAGNOSIS — Z930 Tracheostomy status: Secondary | ICD-10-CM | POA: Diagnosis not present

## 2020-02-22 DIAGNOSIS — J690 Pneumonitis due to inhalation of food and vomit: Secondary | ICD-10-CM | POA: Diagnosis not present

## 2020-02-22 LAB — HEPATIC FUNCTION PANEL
ALT: 58 U/L — ABNORMAL HIGH (ref 0–44)
AST: 64 U/L — ABNORMAL HIGH (ref 15–41)
Albumin: 1.9 g/dL — ABNORMAL LOW (ref 3.5–5.0)
Alkaline Phosphatase: 478 U/L — ABNORMAL HIGH (ref 38–126)
Bilirubin, Direct: 6.9 mg/dL — ABNORMAL HIGH (ref 0.0–0.2)
Indirect Bilirubin: 4.4 mg/dL — ABNORMAL HIGH (ref 0.3–0.9)
Total Bilirubin: 11.3 mg/dL — ABNORMAL HIGH (ref 0.3–1.2)
Total Protein: 7 g/dL (ref 6.5–8.1)

## 2020-02-22 LAB — BASIC METABOLIC PANEL
Anion gap: 14 (ref 5–15)
BUN: 26 mg/dL — ABNORMAL HIGH (ref 8–23)
CO2: 27 mmol/L (ref 22–32)
Calcium: 8.9 mg/dL (ref 8.9–10.3)
Chloride: 97 mmol/L — ABNORMAL LOW (ref 98–111)
Creatinine, Ser: 0.49 mg/dL — ABNORMAL LOW (ref 0.61–1.24)
GFR calc Af Amer: >60 mL/min (ref >60)
GFR calc non Af Amer: >60 mL/min (ref >60)
Glucose, Bld: 126 mg/dL — ABNORMAL HIGH (ref 70–99)
Potassium: 4 mmol/L (ref 3.5–5.1)
Sodium: 138 mmol/L (ref 135–145)

## 2020-02-22 LAB — CBC
HCT: 27.7 % — ABNORMAL LOW (ref 39.0–52.0)
Hemoglobin: 8.8 g/dL — ABNORMAL LOW (ref 13.0–17.0)
MCH: 28.2 pg (ref 26.0–34.0)
MCHC: 31.8 g/dL (ref 30.0–36.0)
MCV: 88.8 fL (ref 80.0–100.0)
Platelets: 409 10*3/uL — ABNORMAL HIGH (ref 150–400)
RBC: 3.12 MIL/uL — ABNORMAL LOW (ref 4.22–5.81)
RDW: 21.3 % — ABNORMAL HIGH (ref 11.5–15.5)
WBC: 8.1 10*3/uL (ref 4.0–10.5)
nRBC: 0 % (ref 0.0–0.2)

## 2020-02-22 NOTE — Progress Notes (Signed)
Pulmonary Critical Care Medicine [facility] [place]   PULMONARY CRITICAL CARE SERVICE  PROGRESS NOTE  Date of Service: 02/22/2020  AXTYN WOEHLER  PYP:[id number]  DOB: [birth year]   DOA: 01/24/2020  Referring Physician: Carron Curie, MD  HPI: TABIAS SWAYZE is a 79 y.o. male seen for follow up of Acute on Chronic Respiratory Failure.  Patient currently is on T collar 28% FiO2 has copious secretions.  Medications: Reviewed on Rounds  Physical Exam:  Vitals: Temperature is 97.6 pulse 73 respiratory 26 blood pressure is 120/68 saturations 100%  Ventilator Settings on T collar FiO2 28%  . General: Comfortable at this time . Eyes: Grossly normal lids, irises & conjunctiva . ENT: grossly tongue is normal . Neck: no obvious mass . Cardiovascular: S1 S2 normal no gallop . Respiratory: No rhonchi no rales are noted at this time . Abdomen: soft . Skin: no rash seen on limited exam . Musculoskeletal: not rigid . Psychiatric:unable to assess . Neurologic: no seizure no involuntary movements         Lab Data:   Basic Metabolic Panel: Recent Labs  Lab 02/16/20 0733 02/19/20 1020 02/22/20 0719  NA 137 137 138  K 4.1 4.4 4.0  CL 96* 99 97*  CO2 29 27 27   GLUCOSE 146* 147* 126*  BUN 32* 27* 26*  CREATININE 0.58* 0.48* 0.49*  CALCIUM 8.9 8.8* 8.9    ABG: No results for input(s): PHART, PCO2ART, PO2ART, HCO3, O2SAT in the last 168 hours.  Liver Function Tests: Recent Labs  Lab 02/16/20 0733 02/19/20 1020 02/21/20 1237 02/22/20 0719  AST 58* 65* 64* 64*  ALT 57* 57* 55* 58*  ALKPHOS 516* 498* 497* 478*  BILITOT 8.7* 11.0* 11.0* 11.3*  PROT 6.7 7.0 7.0 7.0  ALBUMIN 2.0* 2.1* 2.0* 1.9*   No results for input(s): LIPASE, AMYLASE in the last 168 hours. No results for input(s): AMMONIA in the last 168 hours.  CBC: Recent Labs  Lab 02/16/20 0733 02/18/20 1105 02/19/20 1020 02/22/20 0719  WBC 7.6 8.4 7.8 8.1  HGB 8.8* 9.0* 9.1* 8.8*  HCT 27.9* 28.1* 27.2*  27.7*  MCV 88.6 88.4 86.9 88.8  PLT 379 406* 471* 409*    Cardiac Enzymes: No results for input(s): CKTOTAL, CKMB, CKMBINDEX, TROPONINI in the last 168 hours.  BNP (last 3 results) No results for input(s): BNP in the last 8760 hours.  ProBNP (last 3 results) No results for input(s): PROBNP in the last 8760 hours.  Radiological Exams: No results found.  Assessment/Plan Active Problems:   Acute on chronic respiratory failure with hypoxia (HCC)   C3 spinal cord injury, sequela (HCC)   Aspiration pneumonia due to gastric secretions (HCC)   Increased tracheal secretions   Tracheostomy status (HCC)   Elevated bilirubin   1. Acute on chronic respiratory failure hypoxia remains on T collar secondary to copious secretions continue aggressive pulmonary toilet supportive care 2. C3 spinal cord injury no change continue with supportive care. 3. Aspiration pneumonia has been treated follow-up x-rays 4. Retained secretions continue aggressive pulmonary toilet 5. Tracheostomy will remain in place right now   I have personally seen and evaluated the patient, evaluated laboratory and imaging results, formulated the assessment and plan and placed orders. The Patient requires high complexity decision making with multiple systems involvement.  Rounds were done with the Respiratory Therapy Director and Staff therapists and discussed with nursing staff also.  04/23/20, MD [facility] Pulmonary Critical Care Medicine Sleep Medicine

## 2020-02-22 DEATH — deceased

## 2020-02-23 ENCOUNTER — Other Ambulatory Visit (HOSPITAL_COMMUNITY): Payer: Self-pay

## 2020-02-23 DIAGNOSIS — J398 Other specified diseases of upper respiratory tract: Secondary | ICD-10-CM | POA: Diagnosis not present

## 2020-02-23 DIAGNOSIS — Z930 Tracheostomy status: Secondary | ICD-10-CM | POA: Diagnosis not present

## 2020-02-23 DIAGNOSIS — J690 Pneumonitis due to inhalation of food and vomit: Secondary | ICD-10-CM | POA: Diagnosis not present

## 2020-02-23 DIAGNOSIS — J9621 Acute and chronic respiratory failure with hypoxia: Secondary | ICD-10-CM | POA: Diagnosis not present

## 2020-02-23 LAB — URINALYSIS, ROUTINE W REFLEX MICROSCOPIC
Glucose, UA: NEGATIVE mg/dL
Hgb urine dipstick: NEGATIVE
Ketones, ur: NEGATIVE mg/dL
Nitrite: NEGATIVE
Protein, ur: 30 mg/dL — AB
Specific Gravity, Urine: 1.024 (ref 1.005–1.030)
pH: 6 (ref 5.0–8.0)

## 2020-02-23 LAB — SURGICAL PATHOLOGY

## 2020-02-23 NOTE — Progress Notes (Addendum)
Pulmonary Critical Care Medicine [facility] [place]   PULMONARY CRITICAL CARE SERVICE  PROGRESS NOTE  Date of Service: 02/23/2020  Anthony Andersen  [id number]  DOB: June 10, 1941   DOA: 02/08/20  Referring Physician: Carron Curie, MD  HPI: Anthony Andersen is a 79 y.o. male seen for follow up of Acute on Chronic Respiratory Failure.  Patient remains on 20% aerosol trach collar satting well no fever distress at this time.  Medications: Reviewed on Rounds  Physical Exam:  Vitals: Pulse 88 respirations 28 BP 107/63 O2 sat 96% temp 98.5  Ventilator Settings atc 28%  . General: Comfortable at this time . Eyes: Grossly normal lids, irises & conjunctiva . ENT: grossly tongue is normal . Neck: no obvious mass . Cardiovascular: S1 S2 normal no gallop . Respiratory: no rales or ronchi noted . Abdomen: soft . Skin: no rash seen on limited exam . Musculoskeletal: not rigid . Psychiatric:unable to assess . Neurologic: no seizure no involuntary movements         Lab Data:   Basic Metabolic Panel: Recent Labs  Lab 02/19/20 1020 02/22/20 0719  NA 137 138  K 4.4 4.0  CL 99 97*  CO2 27 27  GLUCOSE 147* 126*  BUN 27* 26*  CREATININE 0.48* 0.49*  CALCIUM 8.8* 8.9    ABG: No results for input(s): PHART, PCO2ART, PO2ART, HCO3, O2SAT in the last 168 hours.  Liver Function Tests: Recent Labs  Lab 02/19/20 1020 02/21/20 1237 02/22/20 0719  AST 65* 64* 64*  ALT 57* 55* 58*  ALKPHOS 498* 497* 478*  BILITOT 11.0* 11.0* 11.3*  PROT 7.0 7.0 7.0  ALBUMIN 2.1* 2.0* 1.9*   No results for input(s): LIPASE, AMYLASE in the last 168 hours. No results for input(s): AMMONIA in the last 168 hours.  CBC: Recent Labs  Lab 02/18/20 1105 02/19/20 1020 02/22/20 0719  WBC 8.4 7.8 8.1  HGB 9.0* 9.1* 8.8*  HCT 28.1* 27.2* 27.7*  MCV 88.4 86.9 88.8  PLT 406* 471* 409*    Cardiac Enzymes: No results for input(s): CKTOTAL, CKMB, CKMBINDEX, TROPONINI in the last 168  hours.  BNP (last 3 results) No results for input(s): BNP in the last 8760 hours.  ProBNP (last 3 results) No results for input(s): PROBNP in the last 8760 hours.  Radiological Exams: No results found.  Assessment/Plan Active Problems:   Acute on chronic respiratory failure with hypoxia (HCC)   C3 spinal cord injury, sequela (HCC)   Aspiration pneumonia due to gastric secretions (HCC)   Increased tracheal secretions   Tracheostomy status (HCC)   Elevated bilirubin   1. Acute on chronic respiratory failure hypoxia we will continue with T collar trials titrate oxygen continue pulmonary toilet.  Not tolerating further weaning at this time 2. C3 spinal cord injury no change continue with supportive care 3. Aspiration pneumonia treated we will continue to follow 4. Retained secretions continue aggressive pulmonary toilet 5. Elevated bilirubin per GI work-up 6. Tracheostomy will remain in place secondary to copious secretions   I have personally seen and evaluated the patient, evaluated laboratory and imaging results, formulated the assessment and plan and placed orders. The Patient requires high complexity decision making with multiple systems involvement.  Rounds were done with the Respiratory Therapy Director and Staff therapists and discussed with nursing staff also.  Yevonne Pax, MD [facility] Pulmonary Critical Care Medicine Sleep Medicine

## 2020-02-23 NOTE — Progress Notes (Addendum)
Pulmonary Critical Care Medicine [facility] [place]   PULMONARY CRITICAL CARE SERVICE  PROGRESS NOTE  Date of Service: 02/23/2020  [name]  HYQ:[id number]  DOB: 04-Sep-1941   DOA: 01/29/2020  Referring Physician: Carron Curie, MD  HPI: Anthony Andersen is a 79 y.o. male seen for follow up of Acute on Chronic Respiratory Failure. Patient remains on ATC 28%.  Doing well with no fever or distress.   Medications: Reviewed on Rounds  Physical Exam:  Vitals: Pulse 64 respirations 20 BP 133/81 O2 sat 100% temp 97.5  Ventilator Settings ATC 28%  . General: Comfortable at this time . Eyes: Grossly normal lids, irises & conjunctiva . ENT: grossly tongue is normal . Neck: no obvious mass . Cardiovascular: S1 S2 normal no gallop . Respiratory: No rales or rhonchi noted . Abdomen: soft . Skin: no rash seen on limited exam . Musculoskeletal: not rigid . Psychiatric:unable to assess . Neurologic: no seizure no involuntary movements         Lab Data:   Basic Metabolic Panel: Recent Labs  Lab 02/19/20 1020 02/22/20 0719  NA 137 138  K 4.4 4.0  CL 99 97*  CO2 27 27  GLUCOSE 147* 126*  BUN 27* 26*  CREATININE 0.48* 0.49*  CALCIUM 8.8* 8.9    ABG: No results for input(s): PHART, PCO2ART, PO2ART, HCO3, O2SAT in the last 168 hours.  Liver Function Tests: Recent Labs  Lab 02/19/20 1020 02/21/20 1237 02/22/20 0719  AST 65* 64* 64*  ALT 57* 55* 58*  ALKPHOS 498* 497* 478*  BILITOT 11.0* 11.0* 11.3*  PROT 7.0 7.0 7.0  ALBUMIN 2.1* 2.0* 1.9*   No results for input(s): LIPASE, AMYLASE in the last 168 hours. No results for input(s): AMMONIA in the last 168 hours.  CBC: Recent Labs  Lab 02/18/20 1105 02/19/20 1020 02/22/20 0719  WBC 8.4 7.8 8.1  HGB 9.0* 9.1* 8.8*  HCT 28.1* 27.2* 27.7*  MCV 88.4 86.9 88.8  PLT 406* 471* 409*    Cardiac Enzymes: No results for input(s): CKTOTAL, CKMB, CKMBINDEX, TROPONINI in the last 168 hours.  BNP (last 3  results) No results for input(s): BNP in the last 8760 hours.  ProBNP (last 3 results) No results for input(s): PROBNP in the last 8760 hours.  Radiological Exams: No results found.  Assessment/Plan Active Problems:   Acute on chronic respiratory failure with hypoxia (HCC)   C3 spinal cord injury, sequela (HCC)   Aspiration pneumonia due to gastric secretions (HCC)   Increased tracheal secretions   Tracheostomy status (HCC)   Elevated bilirubin   1. Acute on chronic respiratory failure hypoxia remains on T collar secondary to copious secretions continue aggressive pulmonary toilet supportive care 2. C3 spinal cord injury no change continue with supportive care. 3. Aspiration pneumonia has been treated follow-up x-rays 4. Retained secretions continue aggressive pulmonary toilet 5. Tracheostomy will remain in place right now   I have personally seen and evaluated the patient, evaluated laboratory and imaging results, formulated the assessment and plan and placed orders. The Patient requires high complexity decision making with multiple systems involvement.  Rounds were done with the Respiratory Therapy Director and Staff therapists and discussed with nursing staff also.  Yevonne Pax, MD [facility] Pulmonary Critical Care Medicine Sleep Medicine

## 2020-02-24 DIAGNOSIS — K719 Toxic liver disease, unspecified: Secondary | ICD-10-CM

## 2020-02-24 DIAGNOSIS — J398 Other specified diseases of upper respiratory tract: Secondary | ICD-10-CM | POA: Diagnosis not present

## 2020-02-24 DIAGNOSIS — Z930 Tracheostomy status: Secondary | ICD-10-CM | POA: Diagnosis not present

## 2020-02-24 DIAGNOSIS — J690 Pneumonitis due to inhalation of food and vomit: Secondary | ICD-10-CM | POA: Diagnosis not present

## 2020-02-24 DIAGNOSIS — J9621 Acute and chronic respiratory failure with hypoxia: Secondary | ICD-10-CM | POA: Diagnosis not present

## 2020-02-24 LAB — COMPREHENSIVE METABOLIC PANEL
ALT: 50 U/L — ABNORMAL HIGH (ref 0–44)
AST: 65 U/L — ABNORMAL HIGH (ref 15–41)
Albumin: 1.9 g/dL — ABNORMAL LOW (ref 3.5–5.0)
Alkaline Phosphatase: 472 U/L — ABNORMAL HIGH (ref 38–126)
Anion gap: 12 (ref 5–15)
BUN: 30 mg/dL — ABNORMAL HIGH (ref 8–23)
CO2: 25 mmol/L (ref 22–32)
Calcium: 8.3 mg/dL — ABNORMAL LOW (ref 8.9–10.3)
Chloride: 100 mmol/L (ref 98–111)
Creatinine, Ser: 0.44 mg/dL — ABNORMAL LOW (ref 0.61–1.24)
GFR calc Af Amer: >60 mL/min (ref >60)
GFR calc non Af Amer: >60 mL/min (ref >60)
Glucose, Bld: 108 mg/dL — ABNORMAL HIGH (ref 70–99)
Potassium: 4.3 mmol/L (ref 3.5–5.1)
Sodium: 137 mmol/L (ref 135–145)
Total Bilirubin: 11.8 mg/dL — ABNORMAL HIGH (ref 0.3–1.2)
Total Protein: 6.4 g/dL — ABNORMAL LOW (ref 6.5–8.1)

## 2020-02-24 LAB — CBC
HCT: 27.5 % — ABNORMAL LOW (ref 39.0–52.0)
Hemoglobin: 9.3 g/dL — ABNORMAL LOW (ref 13.0–17.0)
MCH: 28.9 pg (ref 26.0–34.0)
MCHC: 33.8 g/dL (ref 30.0–36.0)
MCV: 85.4 fL (ref 80.0–100.0)
Platelets: 335 10*3/uL (ref 150–400)
RBC: 3.22 MIL/uL — ABNORMAL LOW (ref 4.22–5.81)
RDW: 21.8 % — ABNORMAL HIGH (ref 11.5–15.5)
WBC: 10.7 10*3/uL — ABNORMAL HIGH (ref 4.0–10.5)
nRBC: 0 % (ref 0.0–0.2)

## 2020-02-24 LAB — URINE CULTURE

## 2020-02-24 LAB — LACTIC ACID, PLASMA: Lactic Acid, Venous: 0.9 mmol/L (ref 0.5–1.9)

## 2020-02-24 LAB — MAGNESIUM: Magnesium: 1.9 mg/dL (ref 1.7–2.4)

## 2020-02-24 LAB — PROTIME-INR
INR: 1.3 — ABNORMAL HIGH (ref 0.8–1.2)
Prothrombin Time: 15.5 seconds — ABNORMAL HIGH (ref 11.4–15.2)

## 2020-02-24 LAB — CK: Total CK: 35 U/L — ABNORMAL LOW (ref 49–397)

## 2020-02-24 NOTE — Progress Notes (Signed)
Pulmonary Critical Care Medicine [place]   PULMONARY CRITICAL CARE SERVICE  PROGRESS NOTE  Date of Service: 02/24/2020  BERNICE MULLIN  ZOX:[id number]  DOB: [birth year]   DOA: Feb 19, 2020  Referring Physician: Merton Border, MD  HPI: Anthony Andersen is a 79 y.o. male seen for follow up of Acute on Chronic Respiratory Failure.  Patient is resting comfortably right now without distress remains on T collar has been on 20% FiO2 secretions remain quite currently still  Medications: Reviewed on Rounds  Physical Exam:  Vitals: Temperature 98.9 pulse 120 respiratory 35 blood pressure is 137/67 saturations 96%  Ventilator Settings on T collar FiO2 28%  . General: Comfortable at this time . Eyes: Grossly normal lids, irises & conjunctiva . ENT: grossly tongue is normal . Neck: no obvious mass . Cardiovascular: S1 S2 normal no gallop . Respiratory: No rhonchi coarse breath sounds . Abdomen: soft . Skin: no rash seen on limited exam . Musculoskeletal: not rigid . Psychiatric:unable to assess . Neurologic: no seizure no involuntary movements         Lab Data:   Basic Metabolic Panel: Recent Labs  Lab 02/19/20 1020 02/22/20 0719 02/24/20 0405  NA 137 138 137  K 4.4 4.0 4.3  CL 99 97* 100  CO2 27 27 25   GLUCOSE 147* 126* 108*  BUN 27* 26* 30*  CREATININE 0.48* 0.49* 0.44*  CALCIUM 8.8* 8.9 8.3*  MG  --   --  1.9    ABG: No results for input(s): PHART, PCO2ART, PO2ART, HCO3, O2SAT in the last 168 hours.  Liver Function Tests: Recent Labs  Lab 02/19/20 1020 02/21/20 1237 02/22/20 0719 02/24/20 0405  AST 65* 64* 64* 65*  ALT 57* 55* 58* 50*  ALKPHOS 498* 497* 478* 472*  BILITOT 11.0* 11.0* 11.3* 11.8*  PROT 7.0 7.0 7.0 6.4*  ALBUMIN 2.1* 2.0* 1.9* 1.9*   No results for input(s): LIPASE, AMYLASE in the last 168 hours. No results for input(s): AMMONIA in the last 168 hours.  CBC: Recent Labs  Lab 02/18/20 1105 02/19/20 1020 02/22/20 0719  02/24/20 0405  WBC 8.4 7.8 8.1 10.7*  HGB 9.0* 9.1* 8.8* 9.3*  HCT 28.1* 27.2* 27.7* 27.5*  MCV 88.4 86.9 88.8 85.4  PLT 406* 471* 409* 335    Cardiac Enzymes: No results for input(s): CKTOTAL, CKMB, CKMBINDEX, TROPONINI in the last 168 hours.  BNP (last 3 results) No results for input(s): BNP in the last 8760 hours.  ProBNP (last 3 results) No results for input(s): PROBNP in the last 8760 hours.  Radiological Exams: DG CHEST PORT 1 VIEW  Result Date: 02/23/2020 CLINICAL DATA:  Fever EXAM: PORTABLE CHEST 1 VIEW COMPARISON:  02/11/2020 FINDINGS: Tracheostomy remains in place, unchanged. Elevation of the right hemidiaphragm is stable. Airspace opacities within the right lung are improved could refer elect residual atelectasis or infiltrates. Left lung clear. Heart is normal size. IMPRESSION: Stable elevation of the right hemidiaphragm. Improving airspace disease in the right lung with mild residual atelectasis or infiltrate. Electronically Signed   By: Rolm Baptise M.D.   On: 02/23/2020 19:26    Assessment/Plan Active Problems:   Acute on chronic respiratory failure with hypoxia (HCC)   C3 spinal cord injury, sequela (HCC)   Aspiration pneumonia due to gastric secretions (HCC)   Increased tracheal secretions   Tracheostomy status (HCC)   Elevated bilirubin   1. Acute on chronic respiratory failure with hypoxia we will continue with T collar because of secretions patient  cannot decannulate 2. C3 spinal cord injury continue with supportive care 3. Aspiration pneumonia treated we will continue to follow residual changes noted on the chest films 4. Retained secretions ongoing aggressive pulmonary toilet 5. Tracheostomy will remain in place we will continue to monitor 6. Elevated bilirubin being followed by GI no further intervention per GI   I have personally seen and evaluated the patient, evaluated laboratory and imaging results, formulated the assessment and plan and placed  orders. The Patient requires high complexity decision making with multiple systems involvement.  Rounds were done with the Respiratory Therapy Director and Staff therapists and discussed with nursing staff also.  Yevonne Pax, MD [facility] Pulmonary Critical Care Medicine Sleep Medicine

## 2020-02-24 NOTE — Progress Notes (Signed)
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  LFT are starting to plateau. Continue to monitor LFT and INR No other intervention planned from GI standpoint or diagnostic studies Please call GI if have any further questions.  Iona Beard , MD [phone]

## 2020-02-25 ENCOUNTER — Other Ambulatory Visit (HOSPITAL_COMMUNITY): Payer: Self-pay

## 2020-02-25 DIAGNOSIS — R17 Unspecified jaundice: Secondary | ICD-10-CM | POA: Diagnosis not present

## 2020-02-25 DIAGNOSIS — J398 Other specified diseases of upper respiratory tract: Secondary | ICD-10-CM | POA: Diagnosis not present

## 2020-02-25 DIAGNOSIS — J9621 Acute and chronic respiratory failure with hypoxia: Secondary | ICD-10-CM | POA: Diagnosis not present

## 2020-02-25 DIAGNOSIS — J690 Pneumonitis due to inhalation of food and vomit: Secondary | ICD-10-CM | POA: Diagnosis not present

## 2020-02-25 LAB — BLOOD CULTURE ID PANEL (REFLEXED)

## 2020-02-25 LAB — ECHOCARDIOGRAM COMPLETE

## 2020-02-25 LAB — VANCOMYCIN, TROUGH: Vancomycin Tr: 14 ug/mL — ABNORMAL LOW (ref 15–20)

## 2020-02-25 NOTE — Progress Notes (Signed)
  Echocardiogram 2D Echocardiogram has been performed.  Anthony Andersen 02/25/2020, 2:27 PM

## 2020-02-25 NOTE — Consult Note (Signed)
Infectious Disease Consultation   ERIBERTO FELCH  GLO:[id number]  DOB: 1941-09-08  DOA: 02/11/2020  Requesting physician: Dr. Sharyon Medicus  Reason for consultation: Antibiotic recommendations   History of Present Illness: Anthony Andersen is an 79 y.o. male who was admitted to the outside hospital with level 1 trauma after a fall.  He was found to have gross spinal cord contusion at C3-C4-T1-T2 levels and underwent C3-C5 laminectomy, T1 laminectomy, T2 superior laminectomy, C2-T3 posterior spinal fusion and instrumentation and bone graft.  Patient was also intubated and underwent bronchoscopy during the hospital stay because of mucous plugging.  He underwent CTA which did not show PE.  However, showed collapse of right middle and lower lobes.  He was continued on mechanical ventilator and eventually underwent tracheostomy for continued management.  He had atrial fibrillation during the stay.  His hospital course at the outside facility was complicated by pneumonia for which she was treated with antibiotics. Due to his complex medical problems he was admitted to [facility] on 01/14/2020 for further management of ventilator dependent respiratory failure. Admission labs on 01/15/2020 showed T bili 1, alkaline phosphatase 80, AST/ALT 45/68. He was found to have cholestatic jaundice progressing over the past couple of weeks without any prior history of jaundice.  Acute hepatitis panel was negative.  He had CT done which showed uncomplicated gallstones, ultrasound and MRCP as well which did not show any acute findings.  GI was consulted.  Per gastroenterology concern for drug-induced liver injury likely high suspicion for cefepime.  He had liver biopsy done and per report findings suggestive of subacute drug-induced liver injury. He had urine cultures collected on 02/10/2020 which showed 70,000 colonies per mL of Stenotrophomonas maltophilia.  He also had respiratory cultures on 02/10/2020 which  showed normal respiratory flora.  More recent respiratory cultures on 02/23/2020 showing Pseudomonas aeruginosa.  Patient at this time has a trach in place.  He has a c-collar.  Currently on treatment with ciprofloxacin.  The Pseudomonas on the respiratory cultures is intermediate to ciprofloxacin.   Review of Systems:  Patient has a trach in place, nonverbal.  Unable to obtain review of systems at this time.   Past Medical History: Past Medical History:  Diagnosis Date  . Acute on chronic respiratory failure with hypoxia (HCC)   . Anxiety    "at times"  . Arthritis   . Aspiration pneumonia due to gastric secretions (HCC)   . C3 spinal cord injury, sequela (HCC)   . Headache   . High cholesterol   . History of bronchitis as a child   . Hypertension   . Increased tracheal secretions   . Nocturia   . Tracheostomy status (HCC)   . Urinary frequency   . Wears glasses     Past Surgical History: Past Surgical History:  Procedure Laterality Date  . BACK SURGERY     x2  . COLONOSCOPY    . ESOPHAGOGASTRODUODENOSCOPY    . EUS N/A 02/02/2020   Procedure: FULL UPPER ENDOSCOPIC ULTRASOUND (EUS) RADIAL;  Surgeon: Rachael Fee, MD;  Location: [facility] ENDOSCOPY;  Service: Endoscopy;  Laterality: N/A;  . EYE SURGERY Bilateral    cataracts  . HEMORRHOID SURGERY    . IR GASTROSTOMY TUBE MOD SED  02/02/2020  . KNEE ARTHROSCOPY Left   . LUMBAR LAMINECTOMY/DECOMPRESSION MICRODISCECTOMY N/A 09/06/2015   Procedure: Thorasic nine-eleven DECOMPRESSION;  Surgeon: Venita Lick, MD;  Location: [facility] OR;  Service: Orthopedics;  Laterality: N/A;     Allergies:  No Known Allergies   Social History: Obtained from the chart  reports that he has never smoked. He does not have any smokeless tobacco history on file. He reports current alcohol use. He reports that he does not use drugs.   Family History: Family History  Problem Relation Age of Onset  . Cancer Mother   . Cancer Father   . Diabetes  Mellitus I Other   . Hypertension Other   . Cancer Maternal Grandmother   . Cancer Paternal Grandfather     Physical Exam: Vitals: Temperature 101.1, pulse 97, respiratory rate 35, blood pressure 134/69, pulse oximetry 95%  Constitutional: Ill-appearing male, awake Head: Atraumatic, normocephalic Eyes: PERLA, EOMI, scleral icterus ENMT: external ears and nose appear normal, normal hearing, moist oropharyngeal mucosa Neck: Has c-collar in place, has trach in place CVS: S1-S2 clear, no murmur  Respiratory: Rhonchi, no wheezing Abdomen: Obese, positive bowel sounds, PEG tube Musculoskeletal: Lower extremity edema Neuro: Quadriplegic Psych: stable mood and affect, mental status Skin: no rashes, jaundice  Data reviewed:  I have personally reviewed following labs and imaging studies Labs:  CBC: Recent Labs  Lab 02/19/20 1020 02/22/20 0719 02/24/20 0405  WBC 7.8 8.1 10.7*  HGB 9.1* 8.8* 9.3*  HCT 27.2* 27.7* 27.5*  MCV 86.9 88.8 85.4  PLT 471* 409* 335    Basic Metabolic Panel: Recent Labs  Lab 02/19/20 1020 02/19/20 1020 02/22/20 0719 02/24/20 0405  NA 137  --  138 137  K 4.4   < > 4.0 4.3  CL 99  --  97* 100  CO2 27  --  27 25  GLUCOSE 147*  --  126* 108*  BUN 27*  --  26* 30*  CREATININE 0.48*  --  0.49* 0.44*  CALCIUM 8.8*  --  8.9 8.3*  MG  --   --   --  1.9   < > = values in this interval not displayed.   GFR CrCl cannot be calculated (Unknown ideal weight.). Liver Function Tests: Recent Labs  Lab 02/19/20 1020 02/21/20 1237 02/22/20 0719 02/24/20 0405  AST 65* 64* 64* 65*  ALT 57* 55* 58* 50*  ALKPHOS 498* 497* 478* 472*  BILITOT 11.0* 11.0* 11.3* 11.8*  PROT 7.0 7.0 7.0 6.4*  ALBUMIN 2.1* 2.0* 1.9* 1.9*   No results for input(s): LIPASE, AMYLASE in the last 168 hours. No results for input(s): AMMONIA in the last 168 hours. Coagulation profile Recent Labs  Lab 02/21/20 1237 02/24/20 0405  INR 1.3* 1.3*    Cardiac Enzymes: Recent Labs   Lab 02/24/20 1108  CKTOTAL 35*   BNP: Invalid input(s): POCBNP CBG: No results for input(s): GLUCAP in the last 168 hours. D-Dimer No results for input(s): DDIMER in the last 72 hours. Hgb A1c No results for input(s): HGBA1C in the last 72 hours. Lipid Profile No results for input(s): CHOL, HDL, LDLCALC, TRIG, CHOLHDL, LDLDIRECT in the last 72 hours. Thyroid function studies No results for input(s): TSH, T4TOTAL, T3FREE, THYROIDAB in the last 72 hours.  Invalid input(s): FREET3 Anemia work up No results for input(s): VITAMINB12, FOLATE, FERRITIN, TIBC, IRON, RETICCTPCT in the last 72 hours. Urinalysis    Component Value Date/Time   COLORURINE AMBER (A) 02/23/2020 2154   APPEARANCEUR CLEAR 02/23/2020 2154   LABSPEC 1.024 02/23/2020 2154   PHURINE 6.0 02/23/2020 2154   GLUCOSEU NEGATIVE 02/23/2020 2154   HGBUR NEGATIVE 02/23/2020 2154   BILIRUBINUR MODERATE (A) 02/23/2020 2154   KETONESUR NEGATIVE 02/23/2020 2154   PROTEINUR  30 (A) 02/23/2020 2154   NITRITE NEGATIVE 02/23/2020 2154   LEUKOCYTESUR SMALL (A) 02/23/2020 2154     Microbiology Recent Results (from the past 240 hour(s))  Culture, respiratory (non-expectorated)     Status: None (Preliminary result)   Collection Time: 02/23/20  7:05 PM   Specimen: Tracheal Aspirate; Respiratory  Result Value Ref Range Status   Specimen Description TRACHEAL ASPIRATE  Final   Special Requests NONE  Final   Gram Stain   Final    ABUNDANT WBC PRESENT, PREDOMINANTLY PMN ABUNDANT GRAM POSITIVE RODS MODERATE GRAM NEGATIVE RODS FEW GRAM POSITIVE COCCI    Culture   Final    FEW PSEUDOMONAS AERUGINOSA CULTURE REINCUBATED FOR BETTER GROWTH Performed at [facility], [address] [place], [place] [address]    Report Status PENDING  Incomplete   Organism ID, Bacteria PSEUDOMONAS AERUGINOSA  Final      Susceptibility   Pseudomonas aeruginosa - MIC*    CEFTAZIDIME 2 SENSITIVE Sensitive     CIPROFLOXACIN 2 INTERMEDIATE  Intermediate     GENTAMICIN 4 SENSITIVE Sensitive     IMIPENEM >=16 RESISTANT Resistant     PIP/TAZO 8 SENSITIVE Sensitive     CEFEPIME 4 SENSITIVE Sensitive     * FEW PSEUDOMONAS AERUGINOSA  Culture, blood (Routine X 2) w Reflex to ID Panel     Status: None (Preliminary result)   Collection Time: 02/23/20  8:47 PM   Specimen: BLOOD  Result Value Ref Range Status   Specimen Description BLOOD LEFT HAND  Final   Special Requests   Final    BOTTLES DRAWN AEROBIC AND ANAEROBIC Blood Culture adequate volume   Culture  Setup Time   Final    AEROBIC BOTTLE ONLY GRAM POSITIVE COCCI IN CLUSTERS CRITICAL VALUE NOTED.  VALUE IS CONSISTENT WITH PREVIOUSLY REPORTED AND CALLED VALUE.    Culture   Final    NO GROWTH 2 DAYS Performed at [facility], [address] [place], [place] [address]    Report Status PENDING  Incomplete  Culture, blood (Routine X 2) w Reflex to ID Panel     Status: None (Preliminary result)   Collection Time: 02/23/20  8:52 PM   Specimen: BLOOD  Result Value Ref Range Status   Specimen Description BLOOD LEFT ARM  Final   Special Requests   Final    BOTTLES DRAWN AEROBIC AND ANAEROBIC Blood Culture adequate volume   Culture  Setup Time   Final    GRAM POSITIVE COCCI IN CLUSTERS AEROBIC BOTTLE ONLY CRITICAL RESULT CALLED TO, READ BACK BY AND VERIFIED WITHBoyce Medici RN 02/25/20 0032 JDW Performed at [facility], [address], [place], [place] [address]    Culture GRAM POSITIVE COCCI  Final   Report Status PENDING  Incomplete  Blood Culture ID Panel (Reflexed)     Status: None   Collection Time: 02/23/20  8:52 PM  Result Value Ref Range Status   Enterococcus species NOT DETECTED NOT DETECTED Final   Listeria monocytogenes NOT DETECTED NOT DETECTED Final   Staphylococcus species NOT DETECTED NOT DETECTED Final   Staphylococcus aureus (BCID) NOT DETECTED NOT DETECTED Final   Streptococcus species NOT DETECTED NOT DETECTED Final   Streptococcus agalactiae  NOT DETECTED NOT DETECTED Final   Streptococcus pneumoniae NOT DETECTED NOT DETECTED Final   Streptococcus pyogenes NOT DETECTED NOT DETECTED Final   Acinetobacter baumannii NOT DETECTED NOT DETECTED Final   Enterobacteriaceae species NOT DETECTED NOT DETECTED Final  Enterobacter cloacae complex NOT DETECTED NOT DETECTED Final   Escherichia coli NOT DETECTED NOT DETECTED Final   Klebsiella oxytoca NOT DETECTED NOT DETECTED Final   Klebsiella pneumoniae NOT DETECTED NOT DETECTED Final   Proteus species NOT DETECTED NOT DETECTED Final   Serratia marcescens NOT DETECTED NOT DETECTED Final   Haemophilus influenzae NOT DETECTED NOT DETECTED Final   Neisseria meningitidis NOT DETECTED NOT DETECTED Final   Pseudomonas aeruginosa NOT DETECTED NOT DETECTED Final   Candida albicans NOT DETECTED NOT DETECTED Final   Candida glabrata NOT DETECTED NOT DETECTED Final   Candida krusei NOT DETECTED NOT DETECTED Final   Candida parapsilosis NOT DETECTED NOT DETECTED Final   Candida tropicalis NOT DETECTED NOT DETECTED Final    Comment: Performed at [facility], [place] [address] [place], [place] [address]  Urine Culture     Status: Abnormal   Collection Time: 02/23/20  9:40 PM   Specimen: Urine, Random  Result Value Ref Range Status   Specimen Description URINE, RANDOM  Final   Special Requests   Final    NONE Performed at [facility], [place] [address], [place], [place] [address]    Culture MULTIPLE SPECIES PRESENT, SUGGEST RECOLLECTION (A)  Final   Report Status 02/24/2020 FINAL  Final       Inpatient Medications:   Please see MAR  Radiological Exams on Admission: DG CHEST PORT 1 VIEW  Result Date: 02/23/2020 CLINICAL DATA:  Fever EXAM: PORTABLE CHEST 1 VIEW COMPARISON:  02/11/2020 FINDINGS: Tracheostomy remains in place, unchanged. Elevation of the right hemidiaphragm is stable. Airspace opacities within the right lung are improved could refer elect residual atelectasis or  infiltrates. Left lung clear. Heart is normal size. IMPRESSION: Stable elevation of the right hemidiaphragm. Improving airspace disease in the right lung with mild residual atelectasis or infiltrate. Electronically Signed   By: Rolm Baptise M.D.   On: 02/23/2020 19:26    Impression/Recommendations Active Problems:   Acute on chronic respiratory failure with hypoxia (HCC) Gram-positive bacteremia Pneumonia with Pseudomonas Fever Systemic inflammatory response syndrome Transaminitis, elevated bilirubin Drug-induced liver injury   C3 spinal cord injury, sequela (HCC) Quadriplegia   Aspiration pneumonia due to gastric secretions (HCC)   Increased tracheal secretions   Tracheostomy status (HCC) Atrial fibrillation Protein calorie malnutrition/dysphagia Diabetes mellitus  Acute on chronic respiratory failure with hypoxemia: He is currently on T collar, FiO2 28%.  Pulmonary following.  His recent respiratory culture showing Pseudomonas aeruginosa.  He is currently on treatment with ciprofloxacin.  However, the Pseudomonas is intermediate to the ciprofloxacin.  Unfortunately at this time reluctant to use cephalosporins because of high suspicion for drug-induced liver injury likely secondary to the cefepime.  Therefore will add tobramycin nebulizers.  He also has dysphagia, concern for aspiration therefore at risk for aspiration pneumonia and worsening respiratory failure despite being on antibiotics.  Further management per primary team and pulmonary.  Gram-positive bacteremia: Blood cultures from 02/23/2020 showing gram-positive cocci in clusters in aerobic bottle.  Final identification and sensitivities still pending at this time.  Currently on treatment with IV vancomycin.  Would recommend to continue with IV vancomycin until we have the final culture results.  Follow-up on the final cultures and adjust antibiotics accordingly.  Recommend to send for repeat blood cultures in a.m.    Pneumonia:  Cultures as mentioned above.  Currently on ciprofloxacin.  Will add tobramycin nebulizers.  We will plan to treat for duration of 1 week pending improvement.  Reluctant  to use cephalosporins because of high suspicion of cefepime causing drug-induced liver injury.  Again, as mentioned above he has dysphagia, concern for aspiration and at risk for recurrent and worsening pneumonia secondary to aspiration.  Fever: Likely secondary to the bacteremia, pneumonia.  Currently on IV vancomycin, ciprofloxacin.  Adding tobramycin nebulizers.  Continue to monitor closely.  Plan as mentioned above.  Transaminitis, elevated bilirubin: He had liver biopsy done with findings concerning for drug-induced liver injury with high suspicion for cefepime.  Currently the LFTs plateauing.  He has been seen by GI.  Continue to monitor.  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  Continue with the c-collar.  Continue therapy and supportive management per primary team.  Unfortunately he has quadriplegia from the injury.  Dysphagia/protein calorie malnutrition: Management per primary team.  Due to his dysphagia he is high risk for aspiration and recurrent pneumonia.  Diabetes mellitus: Continue to monitor Accu-Cheks, management of diabetes per the primary team.  Unfortunately due to his multiple complex medical problems he is high risk for worsening and decompensation.  Plan of care discussed with the primary team and pharmacy.  Plan of care also discussed with the wife at the bedside.  Thank you for this consultation.    Vonzella Nipple M.D. 02/25/2020, 4:44 PM

## 2020-02-25 NOTE — Progress Notes (Addendum)
Pulmonary Critical Care Medicine [facility]   PULMONARY CRITICAL CARE SERVICE  PROGRESS NOTE  Date of Service: 02/25/2020  Anthony Andersen  HEN:[id number]  DOB: May 05, 1941   DOA: 02-23-2020  Referring Physician: Merton Border, MD  HPI: Anthony GHUMAN is a 79 y.o. male seen for follow up of Acute on Chronic Respiratory Failure.  Patient remains on 20% aerosol trach collar at this time using PMV has large amount of secretions noted.  Medications: Reviewed on Rounds  Physical Exam:  Vitals: Pulse 6 respirations 20 BP 93/52 O2 sat 98% temp 99.3  Ventilator Settings 28% ATC  . General: Comfortable at this time . Eyes: Grossly normal lids, irises & conjunctiva . ENT: grossly tongue is normal . Neck: no obvious mass . Cardiovascular: S1 S2 normal no gallop . Respiratory: No rales or rhonchi noted . Abdomen: soft . Skin: no rash seen on limited exam . Musculoskeletal: not rigid . Psychiatric:unable to assess . Neurologic: no seizure no involuntary movements         Lab Data:   Basic Metabolic Panel: Recent Labs  Lab 02/19/20 1020 02/22/20 0719 02/24/20 0405  NA 137 138 137  K 4.4 4.0 4.3  CL 99 97* 100  CO2 27 27 25   GLUCOSE 147* 126* 108*  BUN 27* 26* 30*  CREATININE 0.48* 0.49* 0.44*  CALCIUM 8.8* 8.9 8.3*  MG  --   --  1.9    ABG: No results for input(s): PHART, PCO2ART, PO2ART, HCO3, O2SAT in the last 168 hours.  Liver Function Tests: Recent Labs  Lab 02/19/20 1020 02/21/20 1237 02/22/20 0719 02/24/20 0405  AST 65* 64* 64* 65*  ALT 57* 55* 58* 50*  ALKPHOS 498* 497* 478* 472*  BILITOT 11.0* 11.0* 11.3* 11.8*  PROT 7.0 7.0 7.0 6.4*  ALBUMIN 2.1* 2.0* 1.9* 1.9*   No results for input(s): LIPASE, AMYLASE in the last 168 hours. No results for input(s): AMMONIA in the last 168 hours.  CBC: Recent Labs  Lab 02/19/20 1020 02/22/20 0719 02/24/20 0405  WBC 7.8 8.1 10.7*  HGB 9.1* 8.8* 9.3*  HCT 27.2* 27.7* 27.5*  MCV 86.9 88.8 85.4   PLT 471* 409* 335    Cardiac Enzymes: Recent Labs  Lab 02/24/20 1108  CKTOTAL 35*    BNP (last 3 results) No results for input(s): BNP in the last 8760 hours.  ProBNP (last 3 results) No results for input(s): PROBNP in the last 8760 hours.  Radiological Exams: ECHOCARDIOGRAM COMPLETE  Result Date: 02/25/2020    ECHOCARDIOGRAM REPORT   Patient Name:   Anthony Andersen Date of Exam: 02/25/2020 Medical Rec #:  [id number]     Height:       72.0 in Accession #:    [id number]    Weight:       255.0 lb Date of Birth:  1940/11/19     BSA:          2.362 m Patient Age:    11 years      BP:           116/65 mmHg Patient Gender: M             HR:           79 bpm. Exam Location:  Inpatient Procedure: 2D Echo, Cardiac Doppler and Color Doppler Indications:     Bacteremia 790.7 / R78.81  History:         Patient has no prior history of Echocardiogram examinations.  Risk Factors:Non-Smoker.  Sonographer:     Renella Cunas RDCS Referring Phys:  [id number] PRIYA VARGHESE Diagnosing Phys: Orpah Cobb MD IMPRESSIONS  1. Left ventricular ejection fraction, by estimation, is 60 to 65%. The left ventricle has normal function. The left ventricle has no regional wall motion abnormalities. Left ventricular diastolic parameters are consistent with Grade I diastolic dysfunction (impaired relaxation).  2. Right ventricular systolic function is normal. The right ventricular size is normal.  3. Left atrial size was mildly dilated.  4. Moderate pleural effusion in the right lateral region.  5. The mitral valve is degenerative. Trivial mitral valve regurgitation. No evidence of mitral stenosis.  6. The aortic valve is tricuspid. Aortic valve regurgitation is not visualized. Mild aortic valve stenosis.  7. There is mild (Grade II) atheroma plaque involving the aortic root and ascending aorta. FINDINGS  Left Ventricle: Left ventricular ejection fraction, by estimation, is 60 to 65%. The left ventricle has normal function. The  left ventricle has no regional wall motion abnormalities. The left ventricular internal cavity size was normal in size. There is  no left ventricular hypertrophy. Left ventricular diastolic parameters are consistent with Grade I diastolic dysfunction (impaired relaxation). Right Ventricle: The right ventricular size is normal. No increase in right ventricular wall thickness. Right ventricular systolic function is normal. Left Atrium: Left atrial size was mildly dilated. Right Atrium: Right atrial size was normal in size. Pericardium: Trivial pericardial effusion is present. Mitral Valve: The mitral valve is degenerative in appearance. Mild mitral annular calcification. Trivial mitral valve regurgitation. No evidence of mitral valve stenosis. Tricuspid Valve: The tricuspid valve is normal in structure. Tricuspid valve regurgitation is trivial. Aortic Valve: The aortic valve is tricuspid. . There is moderate thickening and moderate calcification of the aortic valve. Aortic valve regurgitation is not visualized. Mild aortic stenosis is present. Moderate aortic valve annular calcification. There is moderate thickening of the aortic valve. There is moderate calcification of the aortic valve. Aortic valve mean gradient measures 12.0 mmHg. Aortic valve peak gradient measures 25.2 mmHg. Aortic valve area, by VTI measures 2.10 cm. Pulmonic Valve: The pulmonic valve was normal in structure. Pulmonic valve regurgitation is not visualized. Aorta: The aortic root is normal in size and structure. There is mild (Grade II) atheroma plaque involving the aortic root and ascending aorta. IAS/Shunts: No atrial level shunt detected by color flow Doppler. Additional Comments: There is a moderate pleural effusion in the right lateral region.  LEFT VENTRICLE PLAX 2D LVIDd:         4.80 cm  Diastology LVIDs:         3.09 cm  LV e' lateral:   8.05 cm/s LV PW:         0.80 cm  LV E/e' lateral: 14.5 LV IVS:        0.84 cm  LV e' medial:     6.85 cm/s LVOT diam:     2.10 cm  LV E/e' medial:  17.1 LV SV:         95 LV SV Index:   40 LVOT Area:     3.46 cm  RIGHT VENTRICLE RV S prime:     19.20 cm/s TAPSE (M-mode): 2.5 cm LEFT ATRIUM             Index       RIGHT ATRIUM           Index LA diam:        4.20 cm 1.78 cm/m  RA Area:  11.70 cm LA Vol (A2C):   30.1 ml 12.74 ml/m RA Volume:   25.50 ml  10.79 ml/m LA Vol (A4C):   32.0 ml 13.55 ml/m LA Biplane Vol: 32.4 ml 13.72 ml/m  AORTIC VALVE AV Area (Vmax):    1.79 cm AV Area (Vmean):   2.02 cm AV Area (VTI):     2.10 cm AV Vmax:           251.00 cm/s AV Vmean:          155.000 cm/s AV VTI:            0.453 m AV Peak Grad:      25.2 mmHg AV Mean Grad:      12.0 mmHg LVOT Vmax:         130.00 cm/s LVOT Vmean:        90.600 cm/s LVOT VTI:          0.275 m LVOT/AV VTI ratio: 0.61  AORTA Ao Root diam: 3.50 cm MITRAL VALVE MV Area (PHT): 2.99 cm     SHUNTS MV Decel Time: 254 msec     Systemic VTI:  0.28 m MV E velocity: 117.00 cm/s  Systemic Diam: 2.10 cm MV A velocity: 145.00 cm/s MV E/A ratio:  0.81 Orpah Cobb MD Electronically signed by Orpah Cobb MD Signature Date/Time: 02/25/2020/6:06:30 PM    Final     Assessment/Plan Active Problems:   Acute on chronic respiratory failure with hypoxia (HCC)   C3 spinal cord injury, sequela (HCC)   Aspiration pneumonia due to gastric secretions (HCC)   Increased tracheal secretions   Tracheostomy status (HCC)   Elevated bilirubin   1. Acute on chronic respiratory failure patient will continue on aerosol trach collar at this time continue supportive measures and pulmonary toilet. 2. C3 spinal cord injury continue with supportive care 3. Aspiration pneumonia treated we will continue to follow residual changes noted on the chest films 4. Retained secretions ongoing aggressive pulmonary toilet 5. Tracheostomy will remain in place we will continue to monitor 6. Elevated bilirubin being followed by GI no further intervention per GI   I have  personally seen and evaluated the patient, evaluated laboratory and imaging results, formulated the assessment and plan and placed orders. The Patient requires high complexity decision making with multiple systems involvement.  Rounds were done with the Respiratory Therapy Director and Staff therapists and discussed with nursing staff also.  Yevonne Pax, MD [facility] Pulmonary Critical Care Medicine Sleep Medicine

## 2020-02-26 DIAGNOSIS — J9621 Acute and chronic respiratory failure with hypoxia: Secondary | ICD-10-CM | POA: Diagnosis not present

## 2020-02-26 DIAGNOSIS — S14103S Unspecified injury at C3 level of cervical spinal cord, sequela: Secondary | ICD-10-CM | POA: Diagnosis not present

## 2020-02-26 DIAGNOSIS — J398 Other specified diseases of upper respiratory tract: Secondary | ICD-10-CM | POA: Diagnosis not present

## 2020-02-26 DIAGNOSIS — J690 Pneumonitis due to inhalation of food and vomit: Secondary | ICD-10-CM | POA: Diagnosis not present

## 2020-02-26 LAB — HEPATIC FUNCTION PANEL
ALT: 41 U/L (ref 0–44)
AST: 50 U/L — ABNORMAL HIGH (ref 15–41)
Albumin: 1.6 g/dL — ABNORMAL LOW (ref 3.5–5.0)
Alkaline Phosphatase: 393 U/L — ABNORMAL HIGH (ref 38–126)
Bilirubin, Direct: 8 mg/dL — ABNORMAL HIGH (ref 0.0–0.2)
Indirect Bilirubin: 5.5 mg/dL — ABNORMAL HIGH (ref 0.3–0.9)
Total Bilirubin: 13.5 mg/dL — ABNORMAL HIGH (ref 0.3–1.2)
Total Protein: 6.1 g/dL — ABNORMAL LOW (ref 6.5–8.1)

## 2020-02-26 LAB — BASIC METABOLIC PANEL
Anion gap: 9 (ref 5–15)
BUN: 26 mg/dL — ABNORMAL HIGH (ref 8–23)
CO2: 26 mmol/L (ref 22–32)
Calcium: 8 mg/dL — ABNORMAL LOW (ref 8.9–10.3)
Chloride: 103 mmol/L (ref 98–111)
Creatinine, Ser: 0.6 mg/dL — ABNORMAL LOW (ref 0.61–1.24)
GFR calc Af Amer: >60 mL/min (ref >60)
GFR calc non Af Amer: >60 mL/min (ref >60)
Glucose, Bld: 105 mg/dL — ABNORMAL HIGH (ref 70–99)
Potassium: 3.5 mmol/L (ref 3.5–5.1)
Sodium: 138 mmol/L (ref 135–145)

## 2020-02-26 LAB — CBC
HCT: 24.2 % — ABNORMAL LOW (ref 39.0–52.0)
Hemoglobin: 8 g/dL — ABNORMAL LOW (ref 13.0–17.0)
MCH: 28.7 pg (ref 26.0–34.0)
MCHC: 33.1 g/dL (ref 30.0–36.0)
MCV: 86.7 fL (ref 80.0–100.0)
Platelets: 328 10*3/uL (ref 150–400)
RBC: 2.79 MIL/uL — ABNORMAL LOW (ref 4.22–5.81)
RDW: 22.1 % — ABNORMAL HIGH (ref 11.5–15.5)
WBC: 11.7 10*3/uL — ABNORMAL HIGH (ref 4.0–10.5)
nRBC: 0 % (ref 0.0–0.2)

## 2020-02-26 LAB — CULTURE, RESPIRATORY W GRAM STAIN

## 2020-02-26 LAB — URINE CULTURE: Culture: NO GROWTH

## 2020-02-26 LAB — PROTIME-INR
INR: 1.5 — ABNORMAL HIGH (ref 0.8–1.2)
Prothrombin Time: 17.7 seconds — ABNORMAL HIGH (ref 11.4–15.2)

## 2020-02-26 NOTE — Progress Notes (Signed)
Pulmonary Critical Care Medicine [facility] [place]   PULMONARY CRITICAL CARE SERVICE  PROGRESS NOTE  Date of Service: 02/26/2020  YASSIR ENIS  CMK:[id number]  DOB: [birth year]   DOA: 02/21/2020  Referring Physician: Carron Curie, MD  HPI: Anthony Andersen is a 79 y.o. male seen for follow up of Acute on Chronic Respiratory Failure.  Patient currently is on T collar has been on 20% FiO2 with good saturations are noted  Medications: Reviewed on Rounds  Physical Exam:  Vitals: Temperature is 98.9 pulse 70 respiratory 30 blood pressure is 92/50 saturations 98%  Ventilator Settings on T collar FiO2 28%  . General: Comfortable at this time . Eyes: Grossly normal lids, irises & conjunctiva . ENT: grossly tongue is normal . Neck: no obvious mass . Cardiovascular: S1 S2 normal no gallop . Respiratory: No rhonchi coarse breath sounds . Abdomen: soft . Skin: no rash seen on limited exam . Musculoskeletal: not rigid . Psychiatric:unable to assess . Neurologic: no seizure no involuntary movements         Lab Data:   Basic Metabolic Panel: Recent Labs  Lab 02/22/20 0719 02/24/20 0405 02/26/20 0707  NA 138 137 138  K 4.0 4.3 3.5  CL 97* 100 103  CO2 27 25 26   GLUCOSE 126* 108* 105*  BUN 26* 30* 26*  CREATININE 0.49* 0.44* 0.60*  CALCIUM 8.9 8.3* 8.0*  MG  --  1.9  --     ABG: No results for input(s): PHART, PCO2ART, PO2ART, HCO3, O2SAT in the last 168 hours.  Liver Function Tests: Recent Labs  Lab 02/21/20 1237 02/22/20 0719 02/24/20 0405 02/26/20 0707  AST 64* 64* 65* 50*  ALT 55* 58* 50* 41  ALKPHOS 497* 478* 472* 393*  BILITOT 11.0* 11.3* 11.8* 13.5*  PROT 7.0 7.0 6.4* 6.1*  ALBUMIN 2.0* 1.9* 1.9* 1.6*   No results for input(s): LIPASE, AMYLASE in the last 168 hours. No results for input(s): AMMONIA in the last 168 hours.  CBC: Recent Labs  Lab 02/22/20 0719 02/24/20 0405 02/26/20 0707  WBC 8.1 10.7* 11.7*  HGB 8.8* 9.3* 8.0*  HCT 27.7*  27.5* 24.2*  MCV 88.8 85.4 86.7  PLT 409* 335 328    Cardiac Enzymes: Recent Labs  Lab 02/24/20 1108  CKTOTAL 35*    BNP (last 3 results) No results for input(s): BNP in the last 8760 hours.  ProBNP (last 3 results) No results for input(s): PROBNP in the last 8760 hours.  Radiological Exams:   Assessment/Plan Active Problems:   Acute on chronic respiratory failure with hypoxia (HCC)   C3 spinal cord injury, sequela (HCC)   Aspiration pneumonia due to gastric secretions (HCC)   Increased tracheal secretions   Tracheostomy status (HCC)   Elevated bilirubin   1. Acute on chronic respiratory failure hypoxia plan is to continue at the T collar weaning as tolerated patient is tolerating it well so far 2. C3 spinal cord injury no change supportive care 3. Aspiration pneumonia treated clinically improving ID following 4. Retained secretions aggressive pulmonary toilet 5. Tracheostomy will remain in place   I have personally seen and evaluated the patient, evaluated laboratory and imaging results, formulated the assessment and plan and placed orders. The Patient requires high complexity decision making with multiple systems involvement.  Rounds were done with the Respiratory Therapy Director and Staff therapists and discussed with nursing staff also.  04/25/20, MD [facility] Pulmonary Critical Care Medicine Sleep Medicine

## 2020-02-27 DIAGNOSIS — J398 Other specified diseases of upper respiratory tract: Secondary | ICD-10-CM | POA: Diagnosis not present

## 2020-02-27 DIAGNOSIS — S14103S Unspecified injury at C3 level of cervical spinal cord, sequela: Secondary | ICD-10-CM | POA: Diagnosis not present

## 2020-02-27 DIAGNOSIS — J690 Pneumonitis due to inhalation of food and vomit: Secondary | ICD-10-CM | POA: Diagnosis not present

## 2020-02-27 DIAGNOSIS — J9621 Acute and chronic respiratory failure with hypoxia: Secondary | ICD-10-CM | POA: Diagnosis not present

## 2020-02-27 LAB — CULTURE, BLOOD (ROUTINE X 2)
Special Requests: ADEQUATE
Special Requests: ADEQUATE

## 2020-02-27 LAB — VANCOMYCIN, TROUGH: Vancomycin Tr: 32 ug/mL (ref 15–20)

## 2020-02-27 NOTE — Progress Notes (Signed)
Pulmonary Critical Care Medicine [facility] [place]   PULMONARY CRITICAL CARE SERVICE  PROGRESS NOTE  Date of Service: 02/27/2020  Anthony Andersen  FUX:[id number]  DOB: October 30, 1940   DOA: 01/27/2020  Referring Physician: Carron Curie, MD  HPI: Anthony Andersen is a 79 y.o. male seen for follow up of Acute on Chronic Respiratory Failure.  Patient currently is on T collar appears to be tolerating it well.  Secretions are still significant  Medications: Reviewed on Rounds  Physical Exam:  Vitals: Temperature 99.6 pulse 97 respiratory rate 25 blood pressure is 152/76 saturations 96%  Ventilator Settings on T collar  . General: Comfortable at this time . Eyes: Grossly normal lids, irises & conjunctiva . ENT: grossly tongue is normal . Neck: no obvious mass . Cardiovascular: S1 S2 normal no gallop . Respiratory: No rhonchi coarse breath sounds . Abdomen: soft . Skin: no rash seen on limited exam . Musculoskeletal: not rigid . Psychiatric:unable to assess . Neurologic: no seizure no involuntary movements         Lab Data:   Basic Metabolic Panel: Recent Labs  Lab 02/22/20 0719 02/24/20 0405 02/26/20 0707  NA 138 137 138  K 4.0 4.3 3.5  CL 97* 100 103  CO2 27 25 26   GLUCOSE 126* 108* 105*  BUN 26* 30* 26*  CREATININE 0.49* 0.44* 0.60*  CALCIUM 8.9 8.3* 8.0*  MG  --  1.9  --     ABG: No results for input(s): PHART, PCO2ART, PO2ART, HCO3, O2SAT in the last 168 hours.  Liver Function Tests: Recent Labs  Lab 02/21/20 1237 02/22/20 0719 02/24/20 0405 02/26/20 0707  AST 64* 64* 65* 50*  ALT 55* 58* 50* 41  ALKPHOS 497* 478* 472* 393*  BILITOT 11.0* 11.3* 11.8* 13.5*  PROT 7.0 7.0 6.4* 6.1*  ALBUMIN 2.0* 1.9* 1.9* 1.6*   No results for input(s): LIPASE, AMYLASE in the last 168 hours. No results for input(s): AMMONIA in the last 168 hours.  CBC: Recent Labs  Lab 02/22/20 0719 02/24/20 0405 02/26/20 0707  WBC 8.1 10.7* 11.7*  HGB 8.8* 9.3* 8.0*   HCT 27.7* 27.5* 24.2*  MCV 88.8 85.4 86.7  PLT 409* 335 328    Cardiac Enzymes: Recent Labs  Lab 02/24/20 1108  CKTOTAL 35*    BNP (last 3 results) No results for input(s): BNP in the last 8760 hours.  ProBNP (last 3 results) No results for input(s): PROBNP in the last 8760 hours.  Radiological Exams: ECHOCARDIOGRAM COMPLETE  Result Date: 02/25/2020    ECHOCARDIOGRAM REPORT   Patient Name:   Anthony Andersen Date of Exam: 02/25/2020 Medical Rec #:  04/26/2020     Height:       72.0 in Accession #:    [id number]    Weight:       255.0 lb Date of Birth:  12-07-1940     BSA:          2.362 m Patient Age:    79 years      BP:           116/65 mmHg Patient Gender: M             HR:           79 bpm. Exam Location:  Inpatient Procedure: 2D Echo, Cardiac Doppler and Color Doppler Indications:     Bacteremia 790.7 / R78.81  History:         Patient has no prior history of Echocardiogram  examinations.                  Risk Factors:Non-Smoker.  Sonographer:     Vickie Epley RDCS Referring Phys:  [place] Diagnosing Phys: Dixie Dials MD IMPRESSIONS  1. Left ventricular ejection fraction, by estimation, is 60 to 65%. The left ventricle has normal function. The left ventricle has no regional wall motion abnormalities. Left ventricular diastolic parameters are consistent with Grade I diastolic dysfunction (impaired relaxation).  2. Right ventricular systolic function is normal. The right ventricular size is normal.  3. Left atrial size was mildly dilated.  4. Moderate pleural effusion in the right lateral region.  5. The mitral valve is degenerative. Trivial mitral valve regurgitation. No evidence of mitral stenosis.  6. The aortic valve is tricuspid. Aortic valve regurgitation is not visualized. Mild aortic valve stenosis.  7. There is mild (Grade II) atheroma plaque involving the aortic root and ascending aorta. FINDINGS  Left Ventricle: Left ventricular ejection fraction, by estimation, is 60 to 65%.  The left ventricle has normal function. The left ventricle has no regional wall motion abnormalities. The left ventricular internal cavity size was normal in size. There is  no left ventricular hypertrophy. Left ventricular diastolic parameters are consistent with Grade I diastolic dysfunction (impaired relaxation). Right Ventricle: The right ventricular size is normal. No increase in right ventricular wall thickness. Right ventricular systolic function is normal. Left Atrium: Left atrial size was mildly dilated. Right Atrium: Right atrial size was normal in size. Pericardium: Trivial pericardial effusion is present. Mitral Valve: The mitral valve is degenerative in appearance. Mild mitral annular calcification. Trivial mitral valve regurgitation. No evidence of mitral valve stenosis. Tricuspid Valve: The tricuspid valve is normal in structure. Tricuspid valve regurgitation is trivial. Aortic Valve: The aortic valve is tricuspid. . There is moderate thickening and moderate calcification of the aortic valve. Aortic valve regurgitation is not visualized. Mild aortic stenosis is present. Moderate aortic valve annular calcification. There is moderate thickening of the aortic valve. There is moderate calcification of the aortic valve. Aortic valve mean gradient measures 12.0 mmHg. Aortic valve peak gradient measures 25.2 mmHg. Aortic valve area, by VTI measures 2.10 cm. Pulmonic Valve: The pulmonic valve was normal in structure. Pulmonic valve regurgitation is not visualized. Aorta: The aortic root is normal in size and structure. There is mild (Grade II) atheroma plaque involving the aortic root and ascending aorta. IAS/Shunts: No atrial level shunt detected by color flow Doppler. Additional Comments: There is a moderate pleural effusion in the right lateral region.  LEFT VENTRICLE PLAX 2D LVIDd:         4.80 cm  Diastology LVIDs:         3.09 cm  LV e' lateral:   8.05 cm/s LV PW:         0.80 cm  LV E/e' lateral: 14.5  LV IVS:        0.84 cm  LV e' medial:    6.85 cm/s LVOT diam:     2.10 cm  LV E/e' medial:  17.1 LV SV:         95 LV SV Index:   40 LVOT Area:     3.46 cm  RIGHT VENTRICLE RV S prime:     19.20 cm/s TAPSE (M-mode): 2.5 cm LEFT ATRIUM             Index       RIGHT ATRIUM  Index LA diam:        4.20 cm 1.78 cm/m  RA Area:     11.70 cm LA Vol (A2C):   30.1 ml 12.74 ml/m RA Volume:   25.50 ml  10.79 ml/m LA Vol (A4C):   32.0 ml 13.55 ml/m LA Biplane Vol: 32.4 ml 13.72 ml/m  AORTIC VALVE AV Area (Vmax):    1.79 cm AV Area (Vmean):   2.02 cm AV Area (VTI):     2.10 cm AV Vmax:           251.00 cm/s AV Vmean:          155.000 cm/s AV VTI:            0.453 m AV Peak Grad:      25.2 mmHg AV Mean Grad:      12.0 mmHg LVOT Vmax:         130.00 cm/s LVOT Vmean:        90.600 cm/s LVOT VTI:          0.275 m LVOT/AV VTI ratio: 0.61  AORTA Ao Root diam: 3.50 cm MITRAL VALVE MV Area (PHT): 2.99 cm     SHUNTS MV Decel Time: 254 msec     Systemic VTI:  0.28 m MV E velocity: 117.00 cm/s  Systemic Diam: 2.10 cm MV A velocity: 145.00 cm/s MV E/A ratio:  0.81 Orpah Cobb MD Electronically signed by Orpah Cobb MD Signature Date/Time: 02/25/2020/6:06:30 PM    Final     Assessment/Plan Active Problems:   Acute on chronic respiratory failure with hypoxia (HCC)   C3 spinal cord injury, sequela (HCC)   Aspiration pneumonia due to gastric secretions (HCC)   Increased tracheal secretions   Tracheostomy status (HCC)   Elevated bilirubin   1. Acute on chronic respiratory failure hypoxia remains on T collar good saturations we will continue with supportive care 2. C3 spinal cord injury no change supportive care 3. Aspiration pneumonia treated improving slow to improve though 4. Retained secretions continue aggressive pulmonary toilet 5. Tracheostomy remains in place   I have personally seen and evaluated the patient, evaluated laboratory and imaging results, formulated the assessment and plan and placed  orders. The Patient requires high complexity decision making with multiple systems involvement.  Rounds were done with the Respiratory Therapy Director and Staff therapists and discussed with nursing staff also.  Yevonne Pax, MD [facility] Pulmonary Critical Care Medicine Sleep Medicine

## 2020-02-28 DIAGNOSIS — S14103S Unspecified injury at C3 level of cervical spinal cord, sequela: Secondary | ICD-10-CM | POA: Diagnosis not present

## 2020-02-28 DIAGNOSIS — J398 Other specified diseases of upper respiratory tract: Secondary | ICD-10-CM | POA: Diagnosis not present

## 2020-02-28 DIAGNOSIS — J690 Pneumonitis due to inhalation of food and vomit: Secondary | ICD-10-CM | POA: Diagnosis not present

## 2020-02-28 DIAGNOSIS — J9621 Acute and chronic respiratory failure with hypoxia: Secondary | ICD-10-CM | POA: Diagnosis not present

## 2020-02-28 LAB — BASIC METABOLIC PANEL
Anion gap: 10 (ref 5–15)
BUN: 49 mg/dL — ABNORMAL HIGH (ref 8–23)
CO2: 25 mmol/L (ref 22–32)
Calcium: 7.9 mg/dL — ABNORMAL LOW (ref 8.9–10.3)
Chloride: 100 mmol/L (ref 98–111)
Creatinine, Ser: 1.55 mg/dL — ABNORMAL HIGH (ref 0.61–1.24)
GFR calc Af Amer: 49 mL/min — ABNORMAL LOW (ref >60)
GFR calc non Af Amer: 43 mL/min — ABNORMAL LOW (ref >60)
Glucose, Bld: 115 mg/dL — ABNORMAL HIGH (ref 70–99)
Potassium: 4.1 mmol/L (ref 3.5–5.1)
Sodium: 135 mmol/L (ref 135–145)

## 2020-02-28 LAB — CBC
HCT: 23.1 % — ABNORMAL LOW (ref 39.0–52.0)
Hemoglobin: 7.3 g/dL — ABNORMAL LOW (ref 13.0–17.0)
MCH: 27.8 pg (ref 26.0–34.0)
MCHC: 31.6 g/dL (ref 30.0–36.0)
MCV: 87.8 fL (ref 80.0–100.0)
Platelets: 324 10*3/uL (ref 150–400)
RBC: 2.63 MIL/uL — ABNORMAL LOW (ref 4.22–5.81)
RDW: 22.5 % — ABNORMAL HIGH (ref 11.5–15.5)
WBC: 11.8 10*3/uL — ABNORMAL HIGH (ref 4.0–10.5)
nRBC: 0 % (ref 0.0–0.2)

## 2020-02-28 LAB — HEPATIC FUNCTION PANEL
ALT: 33 U/L (ref 0–44)
AST: 53 U/L — ABNORMAL HIGH (ref 15–41)
Albumin: 1.3 g/dL — ABNORMAL LOW (ref 3.5–5.0)
Alkaline Phosphatase: 358 U/L — ABNORMAL HIGH (ref 38–126)
Bilirubin, Direct: 8.9 mg/dL — ABNORMAL HIGH (ref 0.0–0.2)
Indirect Bilirubin: 4.4 mg/dL — ABNORMAL HIGH (ref 0.3–0.9)
Total Bilirubin: 13.3 mg/dL — ABNORMAL HIGH (ref 0.3–1.2)
Total Protein: 5.6 g/dL — ABNORMAL LOW (ref 6.5–8.1)

## 2020-02-28 LAB — PROTIME-INR
INR: 1.5 — ABNORMAL HIGH (ref 0.8–1.2)
Prothrombin Time: 17.6 seconds — ABNORMAL HIGH (ref 11.4–15.2)

## 2020-02-28 LAB — VANCOMYCIN, TROUGH: Vancomycin Tr: 29 ug/mL (ref 15–20)

## 2020-02-28 NOTE — Progress Notes (Signed)
Pulmonary Critical Care Medicine [facility] [place]   PULMONARY CRITICAL CARE SERVICE  PROGRESS NOTE  Date of Service: 02/28/2020  Anthony Andersen  YSA:[id number]  DOB: 17-Apr-1941   DOA: 01-Mar-2020  Referring Physician: Carron Curie, MD  HPI: Anthony Andersen is a 79 y.o. male seen for follow up of Acute on Chronic Respiratory Failure.  Patient currently is on T collar good saturations are noted at this time  Medications: Reviewed on Rounds  Physical Exam:  Vitals: Temperature is 99.5 pulse 93 respiratory rate 23 blood pressures 124/59 saturations 97%  Ventilator Settings on T collar 28%  . General: Comfortable at this time . Eyes: Grossly normal lids, irises & conjunctiva . ENT: grossly tongue is normal . Neck: no obvious mass . Cardiovascular: S1 S2 normal no gallop . Respiratory: No rhonchi no rales are noted at this time . Abdomen: soft . Skin: no rash seen on limited exam . Musculoskeletal: not rigid . Psychiatric:unable to assess . Neurologic: no seizure no involuntary movements         Lab Data:   Basic Metabolic Panel: Recent Labs  Lab 02/22/20 0719 02/24/20 0405 02/26/20 0707 02/28/20 0657  NA 138 137 138 135  K 4.0 4.3 3.5 4.1  CL 97* 100 103 100  CO2 27 25 26 25   GLUCOSE 126* 108* 105* 115*  BUN 26* 30* 26* 49*  CREATININE 0.49* 0.44* 0.60* 1.55*  CALCIUM 8.9 8.3* 8.0* 7.9*  MG  --  1.9  --   --     ABG: No results for input(s): PHART, PCO2ART, PO2ART, HCO3, O2SAT in the last 168 hours.  Liver Function Tests: Recent Labs  Lab 02/22/20 0719 02/24/20 0405 02/26/20 0707 02/28/20 0657  AST 64* 65* 50* 53*  ALT 58* 50* 41 33  ALKPHOS 478* 472* 393* 358*  BILITOT 11.3* 11.8* 13.5* 13.3*  PROT 7.0 6.4* 6.1* 5.6*  ALBUMIN 1.9* 1.9* 1.6* 1.3*   No results for input(s): LIPASE, AMYLASE in the last 168 hours. No results for input(s): AMMONIA in the last 168 hours.  CBC: Recent Labs  Lab 02/22/20 0719 02/24/20 0405 02/26/20 0707  02/28/20 0657  WBC 8.1 10.7* 11.7* 11.8*  HGB 8.8* 9.3* 8.0* 7.3*  HCT 27.7* 27.5* 24.2* 23.1*  MCV 88.8 85.4 86.7 87.8  PLT 409* 335 328 324    Cardiac Enzymes: Recent Labs  Lab 02/24/20 1108  CKTOTAL 35*    BNP (last 3 results) No results for input(s): BNP in the last 8760 hours.  ProBNP (last 3 results) No results for input(s): PROBNP in the last 8760 hours.  Radiological Exams: No results found.  Assessment/Plan Active Problems:   Acute on chronic respiratory failure with hypoxia (HCC)   C3 spinal cord injury, sequela (HCC)   Aspiration pneumonia due to gastric secretions (HCC)   Increased tracheal secretions   Tracheostomy status (HCC)   Elevated bilirubin   1. Acute on chronic respiratory failure hypoxia we will continue T collar as ordered titrate oxygen continue pulmonary toilet 2. C3 spinal cord injury no improvement no change 3. Aspiration pneumonia treated improving slowly 4. Tracheostomy remains in place 5. Retained secretions continue aggressive pulmonary toilet   I have personally seen and evaluated the patient, evaluated laboratory and imaging results, formulated the assessment and plan and placed orders. The Patient requires high complexity decision making with multiple systems involvement.  Rounds were done with the Respiratory Therapy Director and Staff therapists and discussed with nursing staff also.  Eddrick Dilone A  Humphrey Rolls, MD [facility] Pulmonary Critical Care Medicine Sleep Medicine

## 2020-02-29 LAB — COMPREHENSIVE METABOLIC PANEL
ALT: 37 U/L (ref 0–44)
AST: 62 U/L — ABNORMAL HIGH (ref 15–41)
Albumin: 1.5 g/dL — ABNORMAL LOW (ref 3.5–5.0)
Alkaline Phosphatase: 405 U/L — ABNORMAL HIGH (ref 38–126)
Anion gap: 15 (ref 5–15)
BUN: 73 mg/dL — ABNORMAL HIGH (ref 8–23)
CO2: 20 mmol/L — ABNORMAL LOW (ref 22–32)
Calcium: 7.8 mg/dL — ABNORMAL LOW (ref 8.9–10.3)
Chloride: 100 mmol/L (ref 98–111)
Creatinine, Ser: 2.26 mg/dL — ABNORMAL HIGH (ref 0.61–1.24)
GFR calc Af Amer: 31 mL/min — ABNORMAL LOW (ref >60)
GFR calc non Af Amer: 27 mL/min — ABNORMAL LOW (ref >60)
Glucose, Bld: 129 mg/dL — ABNORMAL HIGH (ref 70–99)
Potassium: 5.9 mmol/L — ABNORMAL HIGH (ref 3.5–5.1)
Sodium: 135 mmol/L (ref 135–145)
Total Bilirubin: 14.6 mg/dL — ABNORMAL HIGH (ref 0.3–1.2)
Total Protein: 6 g/dL — ABNORMAL LOW (ref 6.5–8.1)

## 2020-02-29 LAB — CBC
HCT: 27.4 % — ABNORMAL LOW (ref 39.0–52.0)
Hemoglobin: 8.3 g/dL — ABNORMAL LOW (ref 13.0–17.0)
MCH: 27.5 pg (ref 26.0–34.0)
MCHC: 30.3 g/dL (ref 30.0–36.0)
MCV: 90.7 fL (ref 80.0–100.0)
Platelets: 503 10*3/uL — ABNORMAL HIGH (ref 150–400)
RBC: 3.02 MIL/uL — ABNORMAL LOW (ref 4.22–5.81)
RDW: 22.8 % — ABNORMAL HIGH (ref 11.5–15.5)
WBC: 19.4 10*3/uL — ABNORMAL HIGH (ref 4.0–10.5)
nRBC: 0.1 % (ref 0.0–0.2)

## 2020-03-02 LAB — CULTURE, BLOOD (ROUTINE X 2)
Culture: NO GROWTH
Culture: NO GROWTH
Special Requests: ADEQUATE
Special Requests: ADEQUATE

## 2020-03-23 DEATH — deceased

## 2021-05-15 IMAGING — MR MR ABDOMEN WO/W CM MRCP
26 of 29 series · 45 of 48 positions shown · IV contrast (gadavist)
Comparison: CT abdomen pelvis, 01/29/2020, right upper quadrant
ultrasound, 02/06/2020

CLINICAL DATA: Cholelithiasis, status post percutaneous gastrostomy

EXAM:
MRI ABDOMEN WITHOUT AND WITH CONTRAST (INCLUDING MRCP)
TECHNIQUE: Multiplanar multisequence MR imaging of the abdomen was performed
both before and after the administration of intravenous contrast.
Heavily T2-weighted images of the biliary and pancreatic ducts were
obtained, and three-dimensional MRCP images were rendered by post
processing. Per technologist note, patient was unable to follow free
breathing commands.
CONTRAST:  10mL GADAVIST GADOBUTROL 1 MMOL/ML IV SOLN

[Series 3: ax haste · axial · 6.0mm · 1.19mm/px · 1 of 40 slices shown]
[im 1/40]
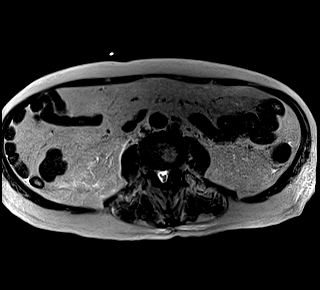

[Series 4: bSSFP · coronal · 6.0mm · 0.74mm/px · 1 of 36 slices shown]
[im 1/36]
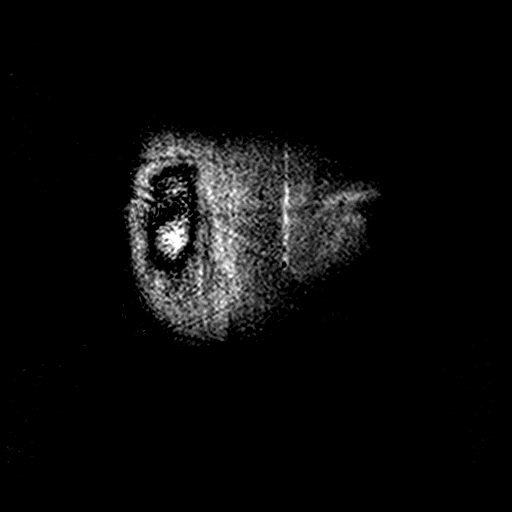

[Series 13: T2 fat-sat · axial · 6.0mm · 1.48mm/px · 1 of 4 slices shown (1 of 2)]
[im 1/4]
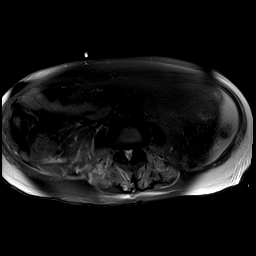

[Series 14: T2 fat-sat · axial · 6.0mm · 1.48mm/px · 1 of 40 slices shown (2 of 2)]
[im 1/40]
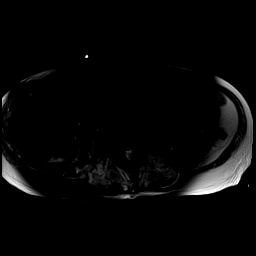

[Series 15: DWI · axial · 6.0mm · 1.42mm/px · z∈[-168,+112]mm · 2 of 120 slices shown (1 of 2)]
[im 1/120]
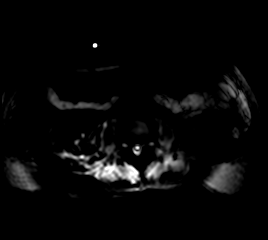
[im 120/120]
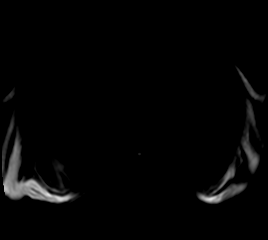

[Series 16: DWI · axial · 6.0mm · 1.42mm/px · 1 of 40 slices shown (2 of 2)]
[im 1/40]
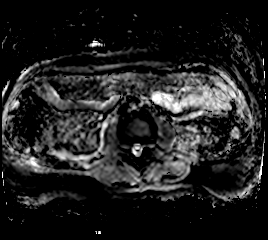

[Series 17: ax in and · axial · 3.0mm · 1.48mm/px · z∈[-182,+127]mm · 2 of 104 slices shown (1 of 2)]
[im 1/104]
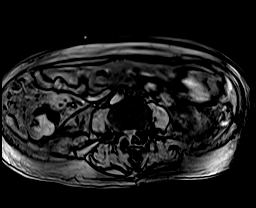
[im 104/104]
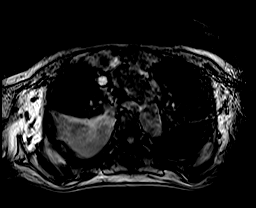

[Series 17: ax in and · axial · 3.0mm · 1.48mm/px · z∈[-182,+127]mm · 2 of 104 slices shown (2 of 2)]
[im 1/104]
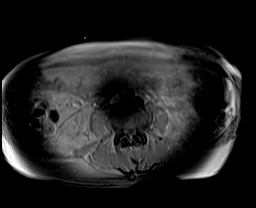
[im 104/104]
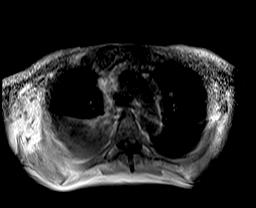

[Series 22: MRCP · coronal · 4.0mm · 1.12mm/px · 1 of 15 slices shown]
[im 1/15]
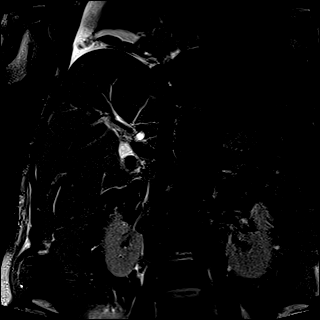

[Series 23: radials · coronal · 50.0mm · 0.78mm/px · 1 of 5 slices shown]
[im 1/5]
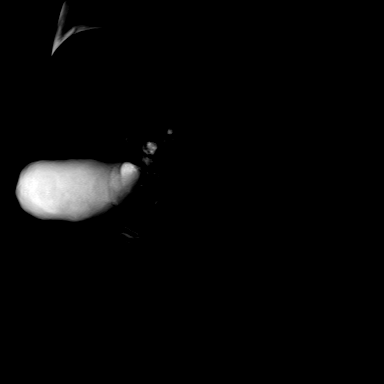

[Series 24: T1 dynamic · axial · non-contrast · 3.0mm · 1.48mm/px · z∈[-197,+112]mm · 2 of 104 slices shown (1 of 2)]
[im 1/104]
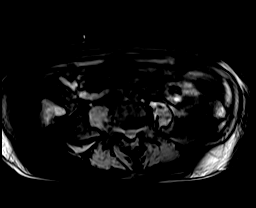
[im 104/104]
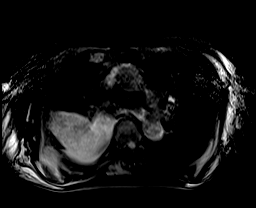

[Series 25: t1_vibe-grasp_fs_tra_static · axial · 3.0mm · 1.48mm/px · z∈[-197,+112]mm · 2 of 104 slices shown]
[im 1/104]
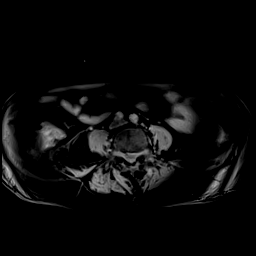
[im 104/104]
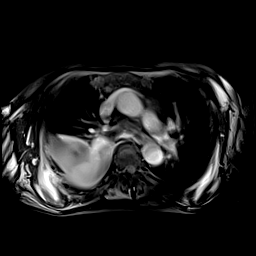

[Series 26: T1 dynamic post-contrast · axial · 3.0mm · 1.48mm/px · z∈[-197,+112]mm · 2 of 104 slices shown (1 of 2)]
[im 1/104]
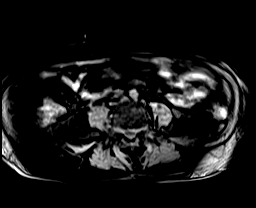
[im 104/104]
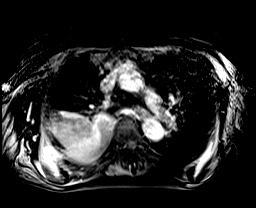

[Series 27: T1 dynamic · axial · 3.0mm · 1.48mm/px · z∈[-197,+112]mm · 2 of 104 slices shown (2 of 2)]
[im 1/104]
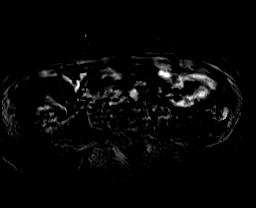
[im 104/104]
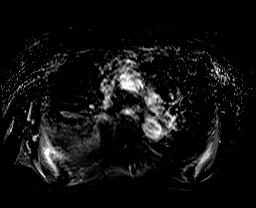

[Series 28: T1 dynamic post-contrast · coronal · 3.0mm · 1.64mm/px · 2 of 80 slices shown (2 of 2)]
[im 1/80]
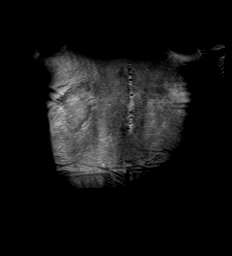
[im 80/80]
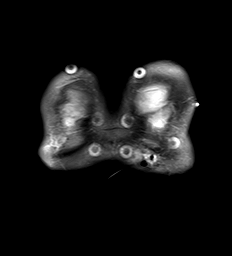

[Series 29: t1_vibe-grasp_fs_tra_red · axial · 3.0mm · 1.48mm/px · z∈[-197,+112]mm · 2 of 104 slices shown (1 of 7)]
[im 1/104]
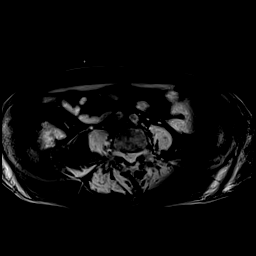
[im 104/104]
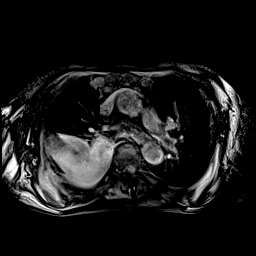

[Series 29: t1_vibe-grasp_fs_tra_red · axial · 3.0mm · 1.48mm/px · z∈[-197,+112]mm · 2 of 104 slices shown (2 of 7)]
[im 1/104]
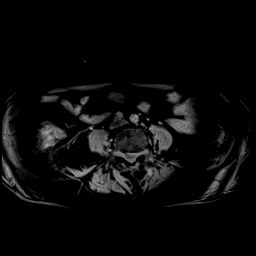
[im 104/104]
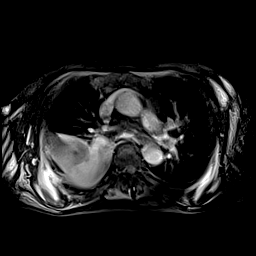

[Series 29: t1_vibe-grasp_fs_tra_red · axial · 3.0mm · 1.48mm/px · z∈[-197,+112]mm · 2 of 104 slices shown (3 of 7)]
[im 1/104]
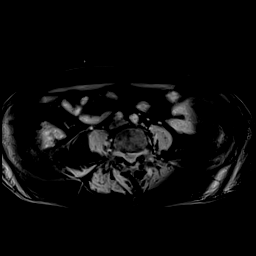
[im 104/104]
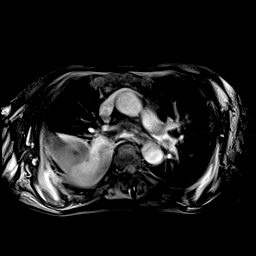

[Series 29: t1_vibe-grasp_fs_tra_red · axial · 3.0mm · 1.48mm/px · z∈[-197,+112]mm · 2 of 104 slices shown (4 of 7)]
[im 1/104]
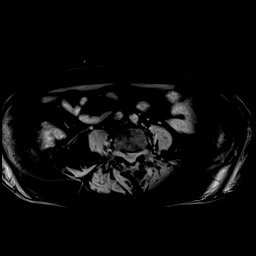
[im 104/104]
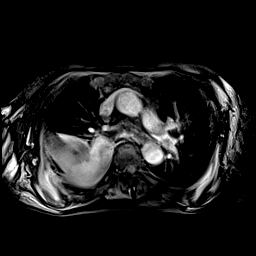

[Series 29: t1_vibe-grasp_fs_tra_red · axial · 3.0mm · 1.48mm/px · z∈[-197,+112]mm · 2 of 104 slices shown (5 of 7)]
[im 1/104]
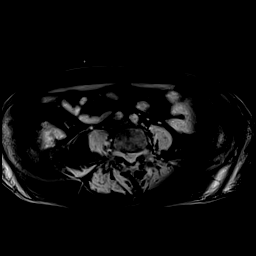
[im 104/104]
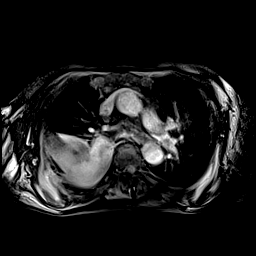

[Series 29: t1_vibe-grasp_fs_tra_red · axial · 3.0mm · 1.48mm/px · z∈[-197,+112]mm · 2 of 104 slices shown (6 of 7)]
[im 1/104]
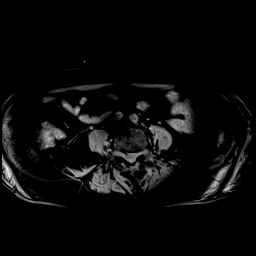
[im 104/104]
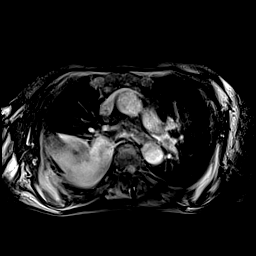

[Series 29: t1_vibe-grasp_fs_tra_red · axial · 3.0mm · 1.48mm/px · z∈[-197,+112]mm · 2 of 104 slices shown (7 of 7)]
[im 1/104]
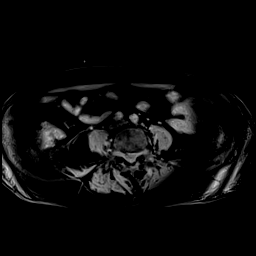
[im 104/104]
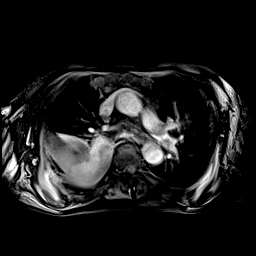

[Series 1004: results sub_subtract 4 · axial · 3.0mm · 1.48mm/px · z∈[-197,+112]mm · 2 of 104 slices shown (1 of 2)]
[im 1/104]
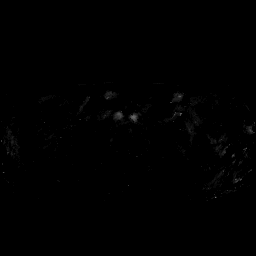
[im 104/104]
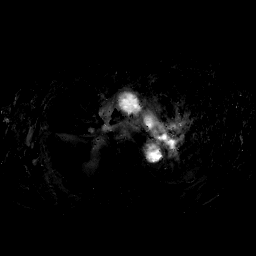

[Series 1004: results sub_subtract 4 · axial · 3.0mm · 1.48mm/px · z∈[-197,+112]mm · 2 of 104 slices shown (2 of 2)]
[im 1/104]
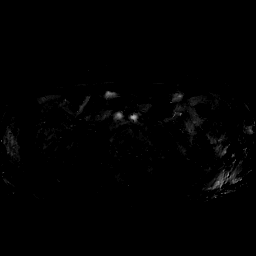
[im 104/104]
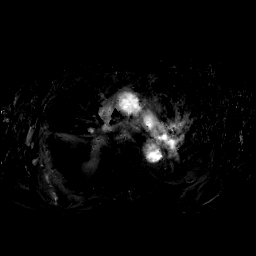

[Series 1008: results sub_subtract portal · axial · portal-venous · 3.0mm · 1.48mm/px · z∈[-197,+112]mm · 2 of 104 slices shown (1 of 2)]
[im 1/104]
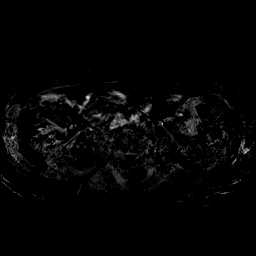
[im 104/104]
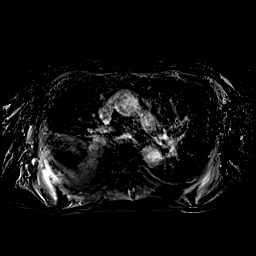

[Series 1008: results sub_subtract portal · axial · portal-venous · 3.0mm · 1.48mm/px · z∈[-197,+112]mm · 2 of 104 slices shown (2 of 2)]
[im 1/104]
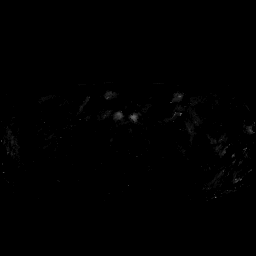
[im 104/104]
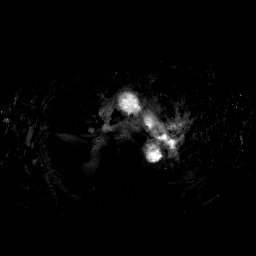

[45 of 48 positions shown; findings below may reference images not displayed]

FINDINGS: Lower chest: Moderate right pleural effusion and associated
atelectasis or consolidation.

Hepatobiliary: No mass or other parenchymal abnormality identified.
Mildly distended gallbladder containing multiple gallstones and
sludge. No biliary ductal dilatation or choledocholithiasis. No
gallbladder wall thickening or pericholecystic fluid. Incidental
note is made of a low insertion of the duct on the common bile duct.

Pancreas: No mass, inflammatory changes, or other parenchymal
abnormality identified.

Spleen:  Within normal limits in size and appearance.

Adrenals/Urinary Tract: No masses identified. No evidence of
hydronephrosis.

Stomach/Bowel: Percutaneous gastrostomy. Visualized portions within
the abdomen are otherwise unremarkable.

Vascular/Lymphatic: No pathologically enlarged lymph nodes
identified. No abdominal aortic aneurysm demonstrated.

Other:  None.

Musculoskeletal: No suspicious bone lesions identified.
IMPRESSION: 1. Mildly distended gallbladder containing multiple gallstones and
sludge. No biliary ductal dilatation or choledocholithiasis. No
gallbladder wall thickening or pericholecystic fluid.
2. Moderate right pleural effusion and associated atelectasis or
consolidation.
3. Percutaneous gastrostomy.

## 2021-05-17 IMAGING — DX DG CHEST 1V PORT
1 series · 1 of 1 positions shown · non-contrast
Comparison: 01/25/2020

CLINICAL DATA: Fever.

EXAM:
PORTABLE CHEST 1 VIEW

[chest ap]
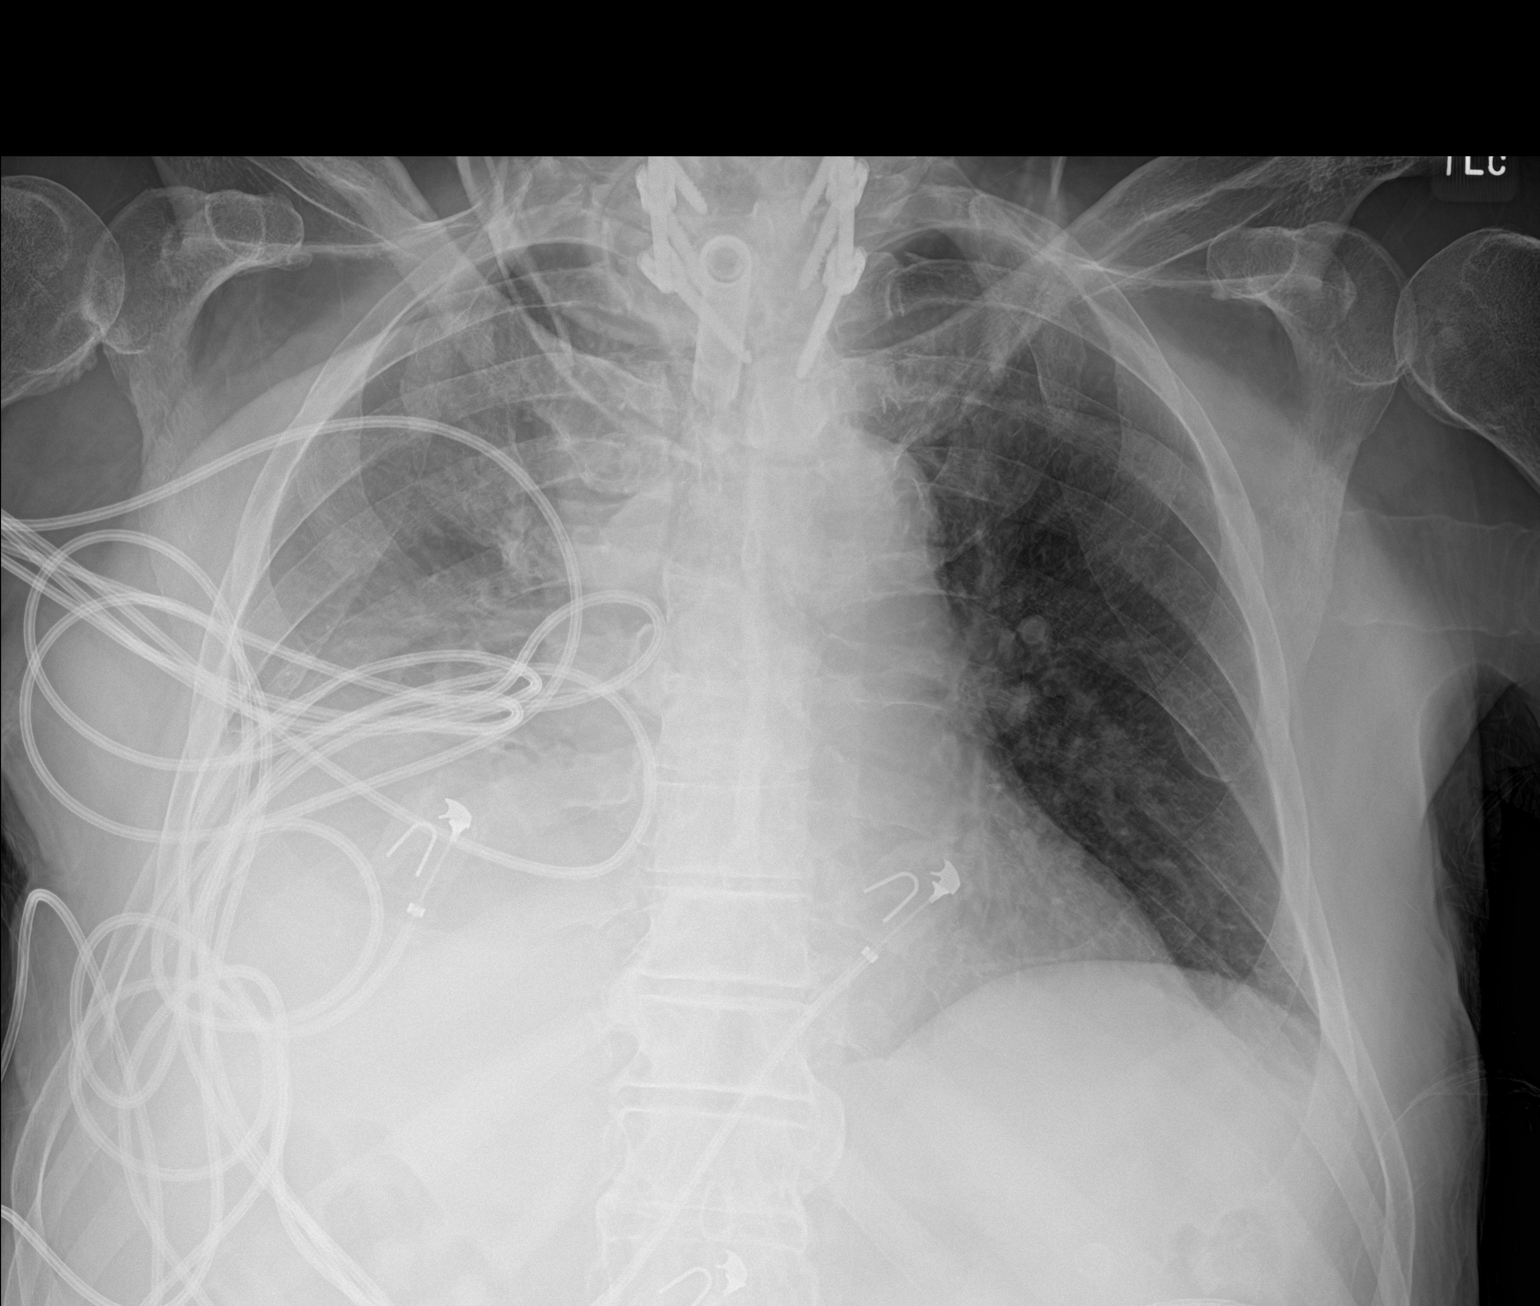

[1 of 1 positions shown; findings below may reference images not displayed]

FINDINGS: The cardiac silhouette, mediastinal and hilar contours are stable.
The tracheostomy tube is stable.

Persistent right lung airspace process and pleural effusion. The
left lung remains relatively clear.
IMPRESSION: Persistent right lung airspace process and right pleural effusion.
